# Patient Record
Sex: Female | Born: 1995 | Race: Black or African American | Hispanic: No | Marital: Single | Smoking: Never smoker
Health system: Southern US, Community
[De-identification: ages and names within clinical notes are randomized; demographics above are authoritative.]

## PROBLEM LIST (undated history)

## (undated) ENCOUNTER — Inpatient Hospital Stay (HOSPITAL_COMMUNITY): Payer: Self-pay

## (undated) DIAGNOSIS — Z789 Other specified health status: Secondary | ICD-10-CM

## (undated) HISTORY — PX: NO PAST SURGERIES: SHX2092

---
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
## 2018-04-12 ENCOUNTER — Encounter (HOSPITAL_COMMUNITY): Payer: Self-pay | Admitting: Emergency Medicine

## 2018-04-12 ENCOUNTER — Emergency Department (HOSPITAL_COMMUNITY)
Admission: EM | Admit: 2018-04-12 | Discharge: 2018-04-12 | Disposition: A | Discharge status: Home / Self Care | Payer: Self-pay | Attending: Emergency Medicine | Admitting: Emergency Medicine

## 2018-04-12 DIAGNOSIS — Z79899 Other long term (current) drug therapy: Secondary | ICD-10-CM | POA: Insufficient documentation

## 2018-04-12 DIAGNOSIS — B9789 Other viral agents as the cause of diseases classified elsewhere: Secondary | ICD-10-CM

## 2018-04-12 DIAGNOSIS — J069 Acute upper respiratory infection, unspecified: Secondary | ICD-10-CM | POA: Insufficient documentation

## 2018-04-12 DIAGNOSIS — F17290 Nicotine dependence, other tobacco product, uncomplicated: Secondary | ICD-10-CM | POA: Insufficient documentation

## 2018-04-12 MED ORDER — PREDNISONE 20 MG PO TABS
40.0000 mg | ORAL_TABLET | Freq: Once | ORAL | Status: AC
  Administered 2018-04-12: 40 mg via ORAL
  Filled 2018-04-12: qty 2

## 2018-04-12 MED ORDER — ALBUTEROL SULFATE HFA 108 (90 BASE) MCG/ACT IN AERS
2.0000 | INHALATION_SPRAY | Freq: Once | RESPIRATORY_TRACT | Status: AC
  Administered 2018-04-12: 2 via RESPIRATORY_TRACT
  Filled 2018-04-12: qty 6.7

## 2018-04-12 MED ORDER — PREDNISONE 20 MG PO TABS: 40.0000 mg | ORAL_TABLET | Freq: Every day | ORAL | 0 refills | Status: DC

## 2018-04-12 MED ORDER — IPRATROPIUM-ALBUTEROL 0.5-2.5 (3) MG/3ML IN SOLN
3.0000 mL | Freq: Once | RESPIRATORY_TRACT | Status: AC
  Administered 2018-04-12: 3 mL via RESPIRATORY_TRACT
  Filled 2018-04-12: qty 3

## 2018-04-12 NOTE — ED Notes (Signed)
ED Provider at bedside. 

## 2018-04-12 NOTE — ED Triage Notes (Signed)
Pt reports since Monday had sore throat, productive cough and chest pains when coughing. Report also has headaches.

## 2018-04-12 NOTE — Discharge Instructions (Signed)
It was my pleasure taking care of you today!   Take prednisone daily as directed getting tomorrow morning.  You were given your first dose in the emergency department today.  Use inhaler every 4-6 hours as needed for cough, wheezing.  Follow up with your primary care provider in 1 week for reevaluation.  Return to the emergency department for fevers, new or worsening symptoms, any additional concerns.

## 2018-04-12 NOTE — ED Provider Notes (Signed)
[facility]-EMERGENCY DEPT Provider Note   CSN: [id number] Arrival date & time: 04/12/18  1221     History   Chief Complaint Chief Complaint  Patient presents with  . Sore Throat  . Cough  . Headache    HPI Kellie Wilson is a 22 y.o. female.  The history is provided by the patient and medical records. No language interpreter was used.  Sore Throat  Associated symptoms include headaches. Pertinent negatives include no abdominal pain and no shortness of breath.  Cough  Associated symptoms include headaches and sore throat. Pertinent negatives include no shortness of breath.  Headache   Pertinent negatives include no shortness of breath, no nausea and no vomiting.   Kellie Wilson is an otherwise healthy 22 year old female who presents the emergency department for evaluation of cough, congestion over the last 2 days.  Associated with sore throat that is actually improving.  Last night, she felt like she could hear herself wheeze.  She denies any history of asthma or prior lung disease.  No medications taken prior to arrival for symptoms.  Denies any known sick contacts.   History reviewed. No pertinent past medical history.  There are no active problems to display for this patient.   History reviewed. No pertinent surgical history.   OB History   None      Home Medications    Prior to Admission medications   Medication Sig Start Date End Date Taking? Authorizing Provider  cetirizine (ZYRTEC ALLERGY) 10 MG tablet Take 1 tablet (10 mg total) by mouth daily. 09/25/14   Everlene Farrier, PA-C  clindamycin (CLEOCIN) 150 MG capsule Take 2 capsules (300 mg total) by mouth every 8 (eight) hours. 06/06/13   Mellody Drown, PA-C  DM-Doxylamine-Acetaminophen (VICKS NYQUIL COLD & FLU) 15-6.25-325 MG/15ML LIQD Take 15 mLs by mouth at bedtime as needed (cold/flu symptoms).    [provider]  fluticasone (FLONASE) 50 MCG/ACT nasal spray Place 2 sprays into  both nostrils daily. 09/25/14   Everlene Farrier, PA-C  ibuprofen (ADVIL,MOTRIN) 200 MG tablet Take 200 mg by mouth every 6 (six) hours as needed.    [provider]  predniSONE (DELTASONE) 20 MG tablet Take 2 tablets (40 mg total) by mouth daily. 04/12/18   Katelynne Revak, Kellie Picket, PA-C    Family History No family history on file.  Social History Social History   Tobacco Use  . Smoking status: Current Every Day Smoker    Types: Cigars  . Smokeless tobacco: Never Used  Substance Use Topics  . Alcohol use: No  . Drug use: No     Allergies   Patient has no known allergies.   Review of Systems Review of Systems  HENT: Positive for congestion and sore throat.   Respiratory: Positive for cough. Negative for shortness of breath.   Gastrointestinal: Negative for abdominal pain, nausea and vomiting.  Neurological: Positive for headaches.  All other systems reviewed and are negative.    Physical Exam Updated Vital Signs BP 138/89 (BP Location: Left Arm)   Pulse 77   Temp 97.6 F (36.4 C) (Oral)   Resp 15   LMP 04/05/2018   SpO2 100%   Physical Exam  Constitutional: She is oriented to person, place, and time. She appears well-developed and well-nourished. No distress.  HENT:  Head: Normocephalic and atraumatic.  + PND.  No tonsillar hypertrophy or exudates.  Neck: Neck supple.  Cardiovascular: Normal rate, regular rhythm and normal heart sounds.  No murmur heard. Pulmonary/Chest:  Effort normal. No respiratory distress.  Faint expiratory wheezing bilaterally.  Abdominal: Soft. She exhibits no distension. There is no tenderness.  Neurological: She is alert and oriented to person, place, and time.  Skin: Skin is warm and dry.  Nursing note and vitals reviewed.    ED Treatments / Results  Labs (all labs ordered are listed, but only abnormal results are displayed) Labs Reviewed - No data to display  EKG None  Radiology No results  found.  Procedures Procedures (including critical care time)  Medications Ordered in ED Medications  ipratropium-albuterol (DUONEB) 0.5-2.5 (3) MG/3ML nebulizer solution 3 mL (3 mLs Nebulization Given 04/12/18 1534)  predniSONE (DELTASONE) tablet 40 mg (40 mg Oral Given 04/12/18 1534)  albuterol (PROVENTIL HFA;VENTOLIN HFA) 108 (90 Base) MCG/ACT inhaler 2 puff (2 puffs Inhalation Given 04/12/18 1534)     Initial Impression / Assessment and Plan / ED Course  I have reviewed the triage vital signs and the nursing notes.  Pertinent labs & imaging results that were available during my care of the patient were reviewed by me and considered in my medical decision making (see chart for details).    Kellie Wilson is a 22 y.o. female who presents to ED for cough, congestion.  On exam, patient is nontoxic-appearing, hemodynamically stable with expiratory wheezing bilaterally.  Speaking in full sentences without difficulty.  Given neb treatment and prednisone in the ER and feels very much improved.  Upon initial evaluation, had recommended chest x-ray, however patient has to pick up her daughter at daycare and does not want to stay for imaging.  Exam clinically is not consistent with pneumonia.  Her lungs were clear after neb treatment.  Given prescription for steroid burst.  Inhaler as needed.  Discussed strict return precautions if symptoms worsen, develops a fever, etc.  All questions answered.    Final Clinical Impressions(s) / ED Diagnoses   Final diagnoses:  Viral URI with cough    ED Discharge Orders         Ordered    predniSONE (DELTASONE) 20 MG tablet  Daily     04/12/18 1531           Kellie Wilson, Kellie Picket, PA-C 04/12/18 1552    Kellie Lefevre, MD 04/12/18 1609

## 2020-11-11 ENCOUNTER — Ambulatory Visit: Payer: Self-pay

## 2020-11-11 ENCOUNTER — Other Ambulatory Visit: Payer: Self-pay

## 2021-02-13 ENCOUNTER — Encounter (HOSPITAL_COMMUNITY): Payer: Self-pay

## 2021-02-13 ENCOUNTER — Other Ambulatory Visit: Payer: Self-pay

## 2021-02-13 ENCOUNTER — Inpatient Hospital Stay (HOSPITAL_COMMUNITY)
Admission: AD | Admit: 2021-02-13 | Discharge: 2021-02-13 | Disposition: A | Discharge status: Home / Self Care | Payer: Medicaid Other | Attending: Obstetrics and Gynecology | Admitting: Obstetrics and Gynecology

## 2021-02-13 DIAGNOSIS — O479 False labor, unspecified: Secondary | ICD-10-CM

## 2021-02-13 DIAGNOSIS — O26893 Other specified pregnancy related conditions, third trimester: Secondary | ICD-10-CM | POA: Diagnosis not present

## 2021-02-13 DIAGNOSIS — Z0371 Encounter for suspected problem with amniotic cavity and membrane ruled out: Secondary | ICD-10-CM

## 2021-02-13 DIAGNOSIS — Z3A35 35 weeks gestation of pregnancy: Secondary | ICD-10-CM | POA: Diagnosis not present

## 2021-02-13 DIAGNOSIS — N898 Other specified noninflammatory disorders of vagina: Secondary | ICD-10-CM | POA: Insufficient documentation

## 2021-02-13 DIAGNOSIS — O4702 False labor before 37 completed weeks of gestation, second trimester: Secondary | ICD-10-CM | POA: Insufficient documentation

## 2021-02-13 HISTORY — DX: Other specified health status: Z78.9

## 2021-02-13 LAB — URINALYSIS, ROUTINE W REFLEX MICROSCOPIC
Bacteria, UA: NONE SEEN
Bilirubin Urine: NEGATIVE
Glucose, UA: NEGATIVE mg/dL
Hgb urine dipstick: NEGATIVE
Ketones, ur: 80 mg/dL — AB
Nitrite: NEGATIVE
Protein, ur: NEGATIVE mg/dL
Specific Gravity, Urine: 1.017 (ref 1.005–1.030)
pH: 6 (ref 5.0–8.0)

## 2021-02-13 LAB — WET PREP, GENITAL
Sperm: NONE SEEN
Trich, Wet Prep: NONE SEEN
Yeast Wet Prep HPF POC: NONE SEEN

## 2021-02-13 LAB — POCT FERN TEST: POCT Fern Test: NEGATIVE

## 2021-02-13 NOTE — MAU Provider Note (Signed)
CC:  Chief Complaint  Patient presents with   Abdominal Pain     Event Date/Time   First Provider Initiated Contact with Patient 02/13/21 1845      HPI: Kellie Wilson is a 25 y.o. year old G83P1001 female at [redacted]w[redacted]d weeks gestation who presents to MAU reporting cramping since 0900 and thin white discharge that has increased over the past two weeks and feels like her vulva is swollen and "heavy". Wonders if she has yeast infection again. Had IC today.   Associated Sx:  Vaginal bleeding: Denies Leaking of fluid: Uncertain Fetal movement: Nml  O: Patient Vitals for the past 24 hrs:  BP Temp Temp src Pulse Resp SpO2  02/13/21 1936 112/67 (!) 97.4 F (36.3 C) Oral 66 17 100 %  02/13/21 1738 117/72 -- -- 73 17 98 %    General: NAD Heart: Regular rate Lungs: Normal rate and effort Abd: Soft, NT, Gravid, S=D Pelvic: NEFG, Neg pooling, neg blood. Small amount of thin, white, mildly malodorous discharge Dilation: 1.5 Effacement (%): Thick Cervical Position: Posterior Station: Ballotable Presentation: Vertex Exam by:: Dorathy Kinsman, CNM  EFM: 135, Moderate variability, 15 x 15 accelerations, no decelerations Toco: Contractions every 3-5 minutes, mild  Results for orders placed or performed during the hospital encounter of 02/13/21 (from the past 24 hour(s))  Urinalysis, Routine w reflex microscopic Urine, Clean Catch     Status: Abnormal   Collection Time: 02/13/21  5:46 PM  Result Value Ref Range   Color, Urine YELLOW YELLOW   APPearance HAZY (A) CLEAR   Specific Gravity, Urine 1.017 1.005 - 1.030   pH 6.0 5.0 - 8.0   Glucose, UA NEGATIVE NEGATIVE mg/dL   Hgb urine dipstick NEGATIVE NEGATIVE   Bilirubin Urine NEGATIVE NEGATIVE   Ketones, ur 80 (A) NEGATIVE mg/dL   Protein, ur NEGATIVE NEGATIVE mg/dL   Nitrite NEGATIVE NEGATIVE   Leukocytes,Ua LARGE (A) NEGATIVE   RBC / HPF 6-10 0 - 5 RBC/hpf   WBC, UA 21-50 0 - 5 WBC/hpf   Bacteria, UA NONE SEEN NONE SEEN   Squamous  Epithelial / LPF 6-10 0 - 5   Mucus PRESENT   Results for JENIECE, HANNIS (MRN [id number]) as of 02/15/2021 20:52  Ref. Range 02/13/2021 20:04  Yeast Wet Prep HPF POC Latest Ref Range: NONE SEEN  NONE SEEN  Trich, Wet Prep Latest Ref Range: NONE SEEN  NONE SEEN  Clue Cells Wet Prep HPF POC Latest Ref Range: NONE SEEN  PRESENT (A)  WBC, Wet Prep HPF POC Latest Ref Range: NONE SEEN  MANY (A)    Fern negative   Orders Placed This Encounter  Procedures   Wet prep, genital   Culture, OB Urine   Urinalysis, Routine w reflex microscopic Urine, Clean Catch   POCT fern test   Discharge patient   No orders of the defined types were placed in this encounter.  MDM - Braxton hicks w/out evidence of active preterm labor - Vaginal discharge and sensation of vulvar swelling. Exam and wet prep C/W BV. Rx Flagyl. No evidence of ROM.   A: [redacted]w[redacted]d week IUP 1. No leakage of amniotic fluid into vagina   2. Vaginal discharge during pregnancy in third trimester   3. Braxton Hicks contractions   FHR reactive  P: Discharge home in stable condition. Preterm labor precautions and fetal kick counts. Follow-up as scheduled for prenatal visit or sooner as needed if symptoms worsen. Return to maternity admissions as needed if symptoms worsen.  Katrinka Blazing, [place], [place] 02/13/2021 7:39 PM  3

## 2021-02-13 NOTE — MAU Note (Addendum)
...  Kellie Wilson is a 25 y.o. at [redacted]w[redacted]d here in MAU reporting: lower abdominal cramping/contractions that began around 0900 this morning shortly after having IC. She states prior to having IC this morning she had a bowel movement and her vagina felt heavy and swollen. Patient denies any bleeding or LOF. +FM. Patient endorses having a yeast infection one month ago and states she received treatment but is wondering if it is returning. She states her discharge does not have an odor, but around two weeks ago it increased in amount and is clear/white and creamy.   Pain score: 5 lower abdominal  Lab orders placed from triage: UA

## 2021-02-15 LAB — CULTURE, OB URINE: Culture: 2000 — AB

## 2021-02-16 ENCOUNTER — Telehealth: Payer: Self-pay | Admitting: Advanced Practice Midwife

## 2021-02-16 ENCOUNTER — Other Ambulatory Visit: Payer: Self-pay | Admitting: Advanced Practice Midwife

## 2021-02-16 ENCOUNTER — Encounter: Payer: Self-pay | Admitting: Advanced Practice Midwife

## 2021-02-16 DIAGNOSIS — N760 Acute vaginitis: Secondary | ICD-10-CM

## 2021-02-16 DIAGNOSIS — B9689 Other specified bacterial agents as the cause of diseases classified elsewhere: Secondary | ICD-10-CM

## 2021-02-16 DIAGNOSIS — R8271 Bacteriuria: Secondary | ICD-10-CM | POA: Insufficient documentation

## 2021-02-16 LAB — GC/CHLAMYDIA PROBE AMP (~~LOC~~) NOT AT ARMC
Chlamydia: NEGATIVE
Comment: NEGATIVE
Comment: NORMAL
Neisseria Gonorrhea: NEGATIVE

## 2021-02-16 MED ORDER — METRONIDAZOLE 500 MG PO TABS: 500.0000 mg | ORAL_TABLET | Freq: Two times a day (BID) | ORAL | 0 refills | Status: DC

## 2021-02-16 NOTE — Progress Notes (Signed)
Your Wet Prep showed that the discharge you where having is Bacterial Vaginosis, an overgrowth of normal bacteria of your vagina. It is not an STD and your partner does not need to be treated. It can make you have more preterm contractions so I have sent an antibiotic to your pharmacy. Take it twice a days for 7 days. Take it with food.

## 2021-02-16 NOTE — Telephone Encounter (Signed)
Attempted to reach patient to notify of GBS bacteruria, abx not indicated but will require tx in labor. VLTCB. Problem list updated. Will attempt contact again later today.  Clayton Bibles, MSN, CNM Certified Nurse Midwife, [facility] [place] for [employer], [facility] 02/16/21 9:23 AM

## 2021-02-16 NOTE — Telephone Encounter (Signed)
Patient reached at home. Identity confirmed. Discussed GBS positive results, impact on labor course. Pt denied questions at end of call.   Clayton Bibles, MSN, CNM Certified Nurse Midwife, [employer] for [employer], [facility] 02/16/21 4:16 PM

## 2021-02-18 DIAGNOSIS — Z3493 Encounter for supervision of normal pregnancy, unspecified, third trimester: Secondary | ICD-10-CM | POA: Insufficient documentation

## 2021-02-18 DIAGNOSIS — O365990 Maternal care for other known or suspected poor fetal growth, unspecified trimester, not applicable or unspecified: Secondary | ICD-10-CM | POA: Insufficient documentation

## 2021-02-23 DIAGNOSIS — O365930 Maternal care for other known or suspected poor fetal growth, third trimester, not applicable or unspecified: Secondary | ICD-10-CM | POA: Insufficient documentation

## 2021-03-12 DIAGNOSIS — O99820 Streptococcus B carrier state complicating pregnancy: Secondary | ICD-10-CM | POA: Insufficient documentation

## 2023-03-13 ENCOUNTER — Other Ambulatory Visit: Payer: Self-pay | Admitting: *Deleted

## 2023-03-13 ENCOUNTER — Encounter: Payer: Self-pay | Admitting: *Deleted

## 2023-03-13 ENCOUNTER — Other Ambulatory Visit (INDEPENDENT_AMBULATORY_CARE_PROVIDER_SITE_OTHER): Payer: Medicaid Other

## 2023-03-13 ENCOUNTER — Ambulatory Visit (INDEPENDENT_AMBULATORY_CARE_PROVIDER_SITE_OTHER): Payer: Medicaid Other | Admitting: *Deleted

## 2023-03-13 VITALS — BP 111/69 | HR 60 | Wt 196.7 lb

## 2023-03-13 DIAGNOSIS — Z3480 Encounter for supervision of other normal pregnancy, unspecified trimester: Secondary | ICD-10-CM

## 2023-03-13 DIAGNOSIS — Z3481 Encounter for supervision of other normal pregnancy, first trimester: Secondary | ICD-10-CM

## 2023-03-13 DIAGNOSIS — Z3A10 10 weeks gestation of pregnancy: Secondary | ICD-10-CM | POA: Diagnosis not present

## 2023-03-13 DIAGNOSIS — O3680X0 Pregnancy with inconclusive fetal viability, not applicable or unspecified: Secondary | ICD-10-CM

## 2023-03-13 DIAGNOSIS — Z1339 Encounter for screening examination for other mental health and behavioral disorders: Secondary | ICD-10-CM | POA: Diagnosis not present

## 2023-03-13 MED ORDER — BLOOD PRESSURE KIT DEVI
1.0000 | 0 refills | Status: DC
Start: 2023-03-13 — End: 2023-09-22

## 2023-03-13 NOTE — Patient Instructions (Signed)

## 2023-03-13 NOTE — Progress Notes (Signed)
New OB Intake  I connected with Kellie Wilson  on 03/13/23 at  9:15 AM EDT by In Person Visit and verified that I am speaking with the correct person using two identifiers. Nurse is located at [facility] and pt is located at [place].  I discussed the limitations, risks, security and privacy concerns of performing an evaluation and management service by telephone and the availability of in person appointments. I also discussed with the patient that there may be a patient responsible charge related to this service. The patient expressed understanding and agreed to proceed.  I explained I am completing New OB Intake today. We discussed EDD of 09/18/2023, by Last Menstrual Period. Pt is G3P1001. I reviewed her allergies, medications and Medical/Surgical/OB history.    Patient Active Problem List   Diagnosis Date Noted   GBS bacteriuria 02/16/2021    Concerns addressed today  Delivery Plans Plans to deliver at [facility]. Discussed the nature of our practice with multiple providers including residents and students. Due to the size of the practice, the delivering provider may not be the same as those providing prenatal care.   Patient is interested in water birth. Offered upcoming OB visit with CNM to discuss further.  MyChart/Babyscripts MyChart access verified. I explained pt will have some visits in office and some virtually. Babyscripts instructions given and order placed. Patient verifies receipt of registration text/e-mail. Account successfully created and app downloaded.  Blood Pressure Cuff/Weight Scale Blood pressure cuff ordered for patient to pick-up from [employer]. Explained after first prenatal appt pt will check weekly and document in Babyscripts. Patient does not have weight scale; patient may purchase if they desire to track weight weekly in Babyscripts.  Anatomy US Explained first scheduled [place] will be around 19 weeks. Anatomy US scheduled for TBD at TBD.  Interested in [place]?  If yes, send referral and doula dot phrase.   Is patient a candidate for Babyscripts Optimization? Yes  First visit review I reviewed new OB appt with patient. Explained pt will be seen by Sharen Counter, CNM at first visit. Discussed Avelina Laine genetic screening with patient. Requests Panorama and Horizon.. Routine prenatal labs  collected at today's visit.    Last Pap No results found for: "DIAGPAP"  Harrel Lemon, RN 03/13/2023  9:21 AM

## 2023-03-14 LAB — CBC/D/PLT+RPR+RH+ABO+RUBIGG...
Antibody Screen: NEGATIVE
Basophils Absolute: 0 10*3/uL (ref 0.0–0.2)
Basos: 0 %
EOS (ABSOLUTE): 0.1 10*3/uL (ref 0.0–0.4)
Eos: 1 %
HCV Ab: NONREACTIVE
HIV Screen 4th Generation wRfx: NONREACTIVE
Hematocrit: 34.9 % (ref 34.0–46.6)
Hemoglobin: 11.2 g/dL (ref 11.1–15.9)
Hepatitis B Surface Ag: NEGATIVE
Immature Grans (Abs): 0 10*3/uL (ref 0.0–0.1)
Immature Granulocytes: 0 %
Lymphocytes Absolute: 1.8 10*3/uL (ref 0.7–3.1)
Lymphs: 24 %
MCH: 29.2 pg (ref 26.6–33.0)
MCHC: 32.1 g/dL (ref 31.5–35.7)
MCV: 91 fL (ref 79–97)
Monocytes Absolute: 0.4 10*3/uL (ref 0.1–0.9)
Monocytes: 6 %
Neutrophils Absolute: 5.3 10*3/uL (ref 1.4–7.0)
Neutrophils: 69 %
Platelets: 205 10*3/uL (ref 150–450)
RBC: 3.83 x10E6/uL (ref 3.77–5.28)
RDW: 11.2 % — ABNORMAL LOW (ref 11.7–15.4)
RPR Ser Ql: NONREACTIVE
Rh Factor: POSITIVE
Rubella Antibodies, IGG: 2.36 {index} (ref >0.99)
WBC: 7.6 10*3/uL (ref 3.4–10.8)

## 2023-03-14 LAB — HEMOGLOBIN A1C
Est. average glucose Bld gHb Est-mCnc: 88 mg/dL
Hgb A1c MFr Bld: 4.7 % — ABNORMAL LOW (ref 4.8–5.6)

## 2023-03-14 LAB — HCV INTERPRETATION

## 2023-03-15 LAB — CULTURE, OB URINE

## 2023-03-15 LAB — URINE CULTURE, OB REFLEX

## 2023-03-19 LAB — PANORAMA PRENATAL TEST FULL PANEL:PANORAMA TEST PLUS 5 ADDITIONAL MICRODELETIONS: FETAL FRACTION: 11.1

## 2023-03-24 LAB — HORIZON CUSTOM: REPORT SUMMARY: POSITIVE — AB

## 2023-03-27 ENCOUNTER — Other Ambulatory Visit (HOSPITAL_COMMUNITY)
Admission: RE | Admit: 2023-03-27 | Discharge: 2023-03-27 | Disposition: A | Discharge status: Home / Self Care | Payer: Medicaid Other | Source: Ambulatory Visit | Attending: Advanced Practice Midwife | Admitting: Advanced Practice Midwife

## 2023-03-27 ENCOUNTER — Encounter: Payer: Self-pay | Admitting: Advanced Practice Midwife

## 2023-03-27 ENCOUNTER — Ambulatory Visit (INDEPENDENT_AMBULATORY_CARE_PROVIDER_SITE_OTHER): Payer: Medicaid Other | Admitting: Advanced Practice Midwife

## 2023-03-27 VITALS — BP 115/67 | HR 60 | Wt 198.0 lb

## 2023-03-27 DIAGNOSIS — Z124 Encounter for screening for malignant neoplasm of cervix: Secondary | ICD-10-CM

## 2023-03-27 DIAGNOSIS — Z3480 Encounter for supervision of other normal pregnancy, unspecified trimester: Secondary | ICD-10-CM

## 2023-03-27 DIAGNOSIS — Z87898 Personal history of other specified conditions: Secondary | ICD-10-CM

## 2023-03-27 DIAGNOSIS — Z113 Encounter for screening for infections with a predominantly sexual mode of transmission: Secondary | ICD-10-CM | POA: Diagnosis present

## 2023-03-27 DIAGNOSIS — Z3A12 12 weeks gestation of pregnancy: Secondary | ICD-10-CM

## 2023-03-27 DIAGNOSIS — Z3481 Encounter for supervision of other normal pregnancy, first trimester: Secondary | ICD-10-CM | POA: Diagnosis not present

## 2023-03-27 NOTE — Progress Notes (Signed)
Subjective:   Kellie Wilson is a 27 y.o. [id number] at [redacted]w[redacted]d by LMP c/w early [place] being seen today for her first obstetrical visit.  Her obstetrical history is significant for intrauterine growth restriction (IUGR) and has Supervision of other normal pregnancy, antepartum on their problem list.. Patient does intend to breast feed. Pregnancy history fully reviewed.  Patient reports  sharp lower abdominal pain with movement .  HISTORY: OB History  Gravida Para Term Preterm AB Living  5 2 2  0 2 1  SAB IAB Ectopic Multiple Live Births  0 2 0 0 1    # Outcome Date GA Lbr Len/2nd Weight Sex Type Anes PTL Lv  5 Current           4 IAB 2024          3 IAB 2024          2 Term 03/12/21    M Vag-Spont   LIV  1 Term 01/27/16 [redacted]w[redacted]d    Vag-Spont      Past Medical History:  Diagnosis Date   Medical history non-contributory    Past Surgical History:  Procedure Laterality Date   NO PAST SURGERIES     Family History  Problem Relation Age of Onset   Diabetes Maternal Grandmother    Social History   Tobacco Use   Smoking status: Never   Smokeless tobacco: Never  Vaping Use   Vaping status: Former   Quit date: 12/26/2022  Substance Use Topics   Alcohol use: No   Drug use: No   No Known Allergies Current Outpatient Medications on File Prior to Visit  Medication Sig Dispense Refill   Prenatal Vit-Fe Fumarate-FA (PRENATAL MULTIVITAMIN) TABS tablet Take 1 tablet by mouth daily at 12 noon.     aspirin EC 81 MG tablet Take 81 mg by mouth daily. Swallow whole. (Patient not taking: Reported on 03/27/2023)     Blood Pressure Monitoring (BLOOD PRESSURE KIT) DEVI 1 Device by Does not apply route once a week. (Patient not taking: Reported on 03/27/2023) 1 each 0   metroNIDAZOLE (FLAGYL) 500 MG tablet Take 1 tablet (500 mg total) by mouth 2 (two) times daily. (Patient not taking: Reported on 03/27/2023) 14 tablet 0   No current facility-administered medications on file prior to visit.      Indications for ASA therapy (per uptodate) One of the following: Previous pregnancy with preeclampsia, especially early onset and with an adverse outcome No Multifetal gestation No Chronic hypertension No Type 1 or 2 diabetes mellitus No Chronic kidney disease No Autoimmune disease (antiphospholipid syndrome, systemic lupus erythematosus) No   Two or more of the following: Nulliparity No Obesity (body mass index >30 kg/m2) No Family history of preeclampsia in mother or sister No Age >=35 years No Sociodemographic characteristics (African American race, low socioeconomic level) Yes Personal risk factors (eg, previous pregnancy with low birth weight or small for gestational age infant, previous adverse pregnancy outcome [eg, stillbirth], interval >10 years between pregnancies) Yes   Indications for early 1 hour GTT (per uptodate)  BMI >25 (>23 in Asian women) AND one of the following  Gestational diabetes mellitus in a previous pregnancy No Glycated hemoglobin >=5.7 percent (39 mmol/mol), impaired glucose tolerance, or impaired fasting glucose on previous testing No First-degree relative with diabetes No High-risk race/ethnicity (eg, African American, Latino, Native American, [place] American, Pacific Islander) Yes History of cardiovascular disease No Hypertension or on therapy for hypertension No High-density lipoprotein cholesterol level <35  mg/dL (1.61 mmol/L) and/or a triglyceride level >250 mg/dL (0.96 mmol/L) No Polycystic ovary syndrome No Physical inactivity No Other clinical condition associated with insulin resistance (eg, severe obesity, acanthosis nigricans) No Previous birth of an infant weighing >=4000 g No Previous stillbirth of unknown cause No Exam   Vitals:   03/27/23 1541  BP: 115/67  Pulse: 60  Weight: 198 lb (89.8 kg)   Fetal Heart Rate (bpm): 154  Uterus:     Pelvic Exam: Perineum: no hemorrhoids, normal perineum   Vulva: normal external genitalia, no  lesions   Vagina:  normal mucosa, normal discharge   Cervix: no lesions and normal, pap smear done.    Adnexa: normal adnexa and no mass, fullness, tenderness   Bony Pelvis: average  System: General: well-developed, well-nourished female in no acute distress   Breast:  normal appearance, no masses or tenderness   Skin: normal coloration and turgor, no rashes   Neurologic: oriented, normal, negative, normal mood   Extremities: normal strength, tone, and muscle mass, ROM of all joints is normal   HEENT PERRLA, extraocular movement intact and sclera clear, anicteric   Mouth/Teeth mucous membranes moist, pharynx normal without lesions and dental hygiene good   Neck supple and no masses   Cardiovascular: regular rate and rhythm   Respiratory:  no respiratory distress, normal breath sounds   Abdomen: soft, non-tender; bowel sounds normal; no masses,  no organomegaly     Assessment:   Pregnancy: [id number] Patient Active Problem List   Diagnosis Date Noted   Supervision of other normal pregnancy, antepartum 03/13/2023   VS reviewed, nursing note reviewed,  Constitutional: well developed, well nourished, no distress HEENT: normocephalic CV: normal rate Pulm/chest wall: normal effort Abdomen: soft Neuro: alert and oriented x 3 Skin: warm, dry Psych: affect normal  Plan:  1. Encounter for screening for cervical cancer  - Cytology - PAP( [place])  2. Routine screening for STI (sexually transmitted infection)  - Cervicovaginal ancillary only( [place])  3. History of poor fetal growth --IOL for FGR at 39 weeks with previous pregnancy, no complications at delivery  4. [redacted] weeks gestation of pregnancy   5. Supervision of other normal pregnancy, antepartum --Anticipatory guidance about next visits/weeks of pregnancy given.   - Pt is interested in waterbirth.  No contraindications at this time per chart review/patient assessment.   - Pt to enroll in class, see CNMs for most  visits in the office.  - Discussed waterbirth as option for low-risk pregnancy.  Reviewed conditions that may arise during pregnancy that will risk pt out of waterbirth including hypertension, diabetes, fetal growth restriction <10%ile, etc.   Initial labs reviewed. Continue prenatal vitamins. NIPS: results reviewed. Ultrasound discussed; fetal anatomic survey: requested. Problem list reviewed and updated. The nature of [facility] with multiple MDs and other Advanced Practice Providers was explained to patient; also emphasized that residents, students are part of our team. Routine obstetric precautions reviewed. No follow-ups on file.   Sharen Counter, CNM 03/27/23 5:45 PM

## 2023-03-29 ENCOUNTER — Encounter: Payer: Self-pay | Admitting: Obstetrics and Gynecology

## 2023-03-29 DIAGNOSIS — D563 Thalassemia minor: Secondary | ICD-10-CM | POA: Insufficient documentation

## 2023-03-29 LAB — CERVICOVAGINAL ANCILLARY ONLY
Bacterial Vaginitis (gardnerella): POSITIVE — AB
Candida Glabrata: NEGATIVE
Candida Vaginitis: NEGATIVE
Chlamydia: NEGATIVE
Comment: NEGATIVE
Comment: NEGATIVE
Comment: NEGATIVE
Comment: NEGATIVE
Comment: NEGATIVE
Comment: NORMAL
Neisseria Gonorrhea: NEGATIVE
Trichomonas: NEGATIVE

## 2023-03-30 LAB — CYTOLOGY - PAP
Comment: NEGATIVE
Diagnosis: NEGATIVE
High risk HPV: NEGATIVE

## 2023-03-31 MED ORDER — METRONIDAZOLE 500 MG PO TABS: 500.0000 mg | ORAL_TABLET | Freq: Two times a day (BID) | ORAL | 0 refills | Status: DC

## 2023-03-31 NOTE — Addendum Note (Signed)
Addended by: Sharen Counter A on: 03/31/2023 09:17 AM   Modules accepted: Orders

## 2023-04-05 ENCOUNTER — Other Ambulatory Visit: Payer: Self-pay

## 2023-04-05 DIAGNOSIS — D563 Thalassemia minor: Secondary | ICD-10-CM

## 2023-04-24 ENCOUNTER — Encounter: Payer: Self-pay | Admitting: Advanced Practice Midwife

## 2023-04-24 ENCOUNTER — Ambulatory Visit (INDEPENDENT_AMBULATORY_CARE_PROVIDER_SITE_OTHER): Payer: Medicaid Other | Admitting: Advanced Practice Midwife

## 2023-04-24 VITALS — BP 117/72 | HR 82 | Wt 204.0 lb

## 2023-04-24 DIAGNOSIS — N643 Galactorrhea not associated with childbirth: Secondary | ICD-10-CM | POA: Insufficient documentation

## 2023-04-24 DIAGNOSIS — Z3A16 16 weeks gestation of pregnancy: Secondary | ICD-10-CM

## 2023-04-24 DIAGNOSIS — Z87898 Personal history of other specified conditions: Secondary | ICD-10-CM

## 2023-04-24 DIAGNOSIS — R102 Pelvic and perineal pain: Secondary | ICD-10-CM

## 2023-04-24 DIAGNOSIS — O26892 Other specified pregnancy related conditions, second trimester: Secondary | ICD-10-CM

## 2023-04-24 DIAGNOSIS — Z3480 Encounter for supervision of other normal pregnancy, unspecified trimester: Secondary | ICD-10-CM

## 2023-04-24 NOTE — Progress Notes (Signed)
   PRENATAL VISIT NOTE  Subjective:  Kellie Wilson is a 27 y.o. [id number] at [redacted]w[redacted]d being seen today for ongoing prenatal care.  She is currently monitored for the following issues for this low-risk pregnancy and has Supervision of other normal pregnancy, antepartum; History of poor fetal growth; Alpha thalassemia silent carrier; and Galactorrhea on their problem list.  Patient reports  mild lower abdominal pressure intermittently .  Contractions: Not present. Vag. Bleeding: None.  Movement: Present. Denies leaking of fluid.   The following portions of the patient's history were reviewed and updated as appropriate: allergies, current medications, past family history, past medical history, past social history, past surgical history and problem list.   Objective:   Vitals:   04/24/23 1541  BP: 117/72  Pulse: 82  Weight: 204 lb (92.5 kg)    Fetal Status: Fetal Heart Rate (bpm): 140   Movement: Present     General:  Alert, oriented and cooperative. Patient is in no acute distress.  Skin: Skin is warm and dry. No rash noted.   Cardiovascular: Normal heart rate noted  Respiratory: Normal respiratory effort, no problems with respiration noted  Abdomen: Soft, gravid, appropriate for gestational age.  Pain/Pressure: Present (a lot of pressure)     Pelvic: Cervical exam deferred        Extremities: Normal range of motion.  Edema: Trace (little in left foot)  Mental Status: Normal mood and affect. Normal behavior. Normal judgment and thought content.   Assessment and Plan:  Pregnancy: [id number] at [redacted]w[redacted]d 1. Supervision of other normal pregnancy, antepartum --Anticipatory guidance about next visits/weeks of pregnancy given.  --Questions answered about safe foods in pregnancy, etc --discussed report of galactorrhea in pt prenatal from last pregnancy. Pt reports she would get milk if she stimulated breasts, even 5  years after her daughter was born. --Discussed normal breast changes after lactation  and reviewed reasons to seek care for breast discharge.  Discussed leaking colostrum in pregnancy and colostrum collection, safe guidelines.   - Pt is interested in waterbirth.  No contraindications at this time per chart review/patient assessment.   - Pt to enroll in class, see CNMs for most visits in the office.  - Discussed waterbirth as option for low-risk pregnancy.  Reviewed conditions that may arise during pregnancy that will risk pt out of waterbirth including hypertension, diabetes, fetal growth restriction <10%ile, etc.   2. History of poor fetal growth --Pt followed with previous pregnancy for FGR, at delivery, infant weighed 6lb8oz.    3. [redacted] weeks gestation of pregnancy   4. Pelvic pressure in pregnancy, antepartum, second trimester --Rest/ice/heat/warm bath/increase PO fluids/Tylenol/pregnancy support belt  --Plan for urine culture but pt did not leave sample, will follow up and pt to return if symptoms persist.   Preterm labor symptoms and general obstetric precautions including but not limited to vaginal bleeding, contractions, leaking of fluid and fetal movement were reviewed in detail with the patient. Please refer to After Visit Summary for other counseling recommendations.   Return in about 4 weeks (around 05/22/2023) for Midwife preferred.  Future Appointments  Date Time Provider Department Center  05/15/2023 12:15 PM [facility] NURSE [facility]  05/15/2023 12:30 PM [facility] US2 [facility]  05/15/2023  1:30 PM [facility] GENETIC COUNSELING RM [facility]  05/22/2023  3:30 PM Lennart Pall, MD [facility] None    Sharen Counter, CNM

## 2023-04-27 ENCOUNTER — Other Ambulatory Visit: Payer: Self-pay

## 2023-04-27 DIAGNOSIS — N898 Other specified noninflammatory disorders of vagina: Secondary | ICD-10-CM

## 2023-04-27 MED ORDER — TERCONAZOLE 0.4 % VA CREA
1.0000 | TOPICAL_CREAM | Freq: Every day | VAGINAL | 0 refills | Status: DC
Start: 2023-04-27 — End: 2023-09-12

## 2023-05-01 DIAGNOSIS — O99210 Obesity complicating pregnancy, unspecified trimester: Secondary | ICD-10-CM | POA: Insufficient documentation

## 2023-05-15 ENCOUNTER — Ambulatory Visit: Payer: Medicaid Other | Attending: Obstetrics and Gynecology

## 2023-05-15 ENCOUNTER — Ambulatory Visit (HOSPITAL_BASED_OUTPATIENT_CLINIC_OR_DEPARTMENT_OTHER): Payer: Medicaid Other

## 2023-05-15 ENCOUNTER — Ambulatory Visit: Payer: Medicaid Other | Admitting: *Deleted

## 2023-05-15 ENCOUNTER — Encounter: Payer: Self-pay | Admitting: *Deleted

## 2023-05-15 ENCOUNTER — Other Ambulatory Visit: Payer: Self-pay | Admitting: *Deleted

## 2023-05-15 VITALS — BP 120/68 | HR 65

## 2023-05-15 DIAGNOSIS — Z148 Genetic carrier of other disease: Secondary | ICD-10-CM | POA: Insufficient documentation

## 2023-05-15 DIAGNOSIS — O09292 Supervision of pregnancy with other poor reproductive or obstetric history, second trimester: Secondary | ICD-10-CM | POA: Diagnosis not present

## 2023-05-15 DIAGNOSIS — Z8759 Personal history of other complications of pregnancy, childbirth and the puerperium: Secondary | ICD-10-CM

## 2023-05-15 DIAGNOSIS — O99210 Obesity complicating pregnancy, unspecified trimester: Secondary | ICD-10-CM

## 2023-05-15 DIAGNOSIS — Z3480 Encounter for supervision of other normal pregnancy, unspecified trimester: Secondary | ICD-10-CM | POA: Insufficient documentation

## 2023-05-15 DIAGNOSIS — Z363 Encounter for antenatal screening for malformations: Secondary | ICD-10-CM | POA: Diagnosis present

## 2023-05-15 DIAGNOSIS — Z3A19 19 weeks gestation of pregnancy: Secondary | ICD-10-CM | POA: Diagnosis not present

## 2023-05-15 DIAGNOSIS — O09299 Supervision of pregnancy with other poor reproductive or obstetric history, unspecified trimester: Secondary | ICD-10-CM

## 2023-05-15 DIAGNOSIS — O321XX0 Maternal care for breech presentation, not applicable or unspecified: Secondary | ICD-10-CM | POA: Insufficient documentation

## 2023-05-15 DIAGNOSIS — D563 Thalassemia minor: Secondary | ICD-10-CM | POA: Diagnosis not present

## 2023-05-15 DIAGNOSIS — O99212 Obesity complicating pregnancy, second trimester: Secondary | ICD-10-CM | POA: Insufficient documentation

## 2023-05-15 NOTE — Progress Notes (Signed)
[facility] for [facility]aternal [facility]etal [facility] at [facility] [address] Phone:  [phone]   Fax:  [phone]      In-Person Genetic Counseling Clinic Note:   I spoke with 27 y.o. Jalayia Elizardo today to discuss her carrier screening results. She is a silent carrier for alpha thalassemia. She was referred by Velvet Bathe, MD. She was accompanied by her partner Danelle Earthly and their son.   Pregnancy History:    [id number]. EGA: 105w4d by [place]. EDD: 10/05/2023. Denies personal history of diabetes, high blood pressure, thyroid conditions, and seizures. Denies bleeding, infections, and fevers in this pregnancy. Denies using tobacco, alcohol, or street drugs in this pregnancy.   Family History:    A three-generation pedigree was created and scanned into Epic under the Media tab.  Maternal ethnicity reported as Black and paternal ethnicity reported as Black. Denies Ashkenazi Jewish ancestry.  Family history not remarkable for consanguinity, individuals with birth defects, intellectual disability, autism spectrum disorder, multiple spontaneous abortions, still births, or unexplained neonatal death.   Silent Carrier for Alpha Thalassemia:  We reviewed the genetics of alpha thalassemia, autosomal recessive mode of inheritance, and clinical features of the conditions.  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 She screened negative for the other three conditions (CF, SMA, and beta-hemoglobinopathies). Please see report for details.  We reviewed that Caeleigh will either pass down two copies of the alpha globin gene (??) OR one copy of the alpha globin gene (-?) in each pregnancy. Therefore, this pregnancy is not at increased risk for hemoglobin Bart's due to four deletions of the alpha globin genes (--/--) regardless of her reproductive partner's carrier status. The pregnancy may be at  increased risk for hemoglobin H disease if her partner is an alpha thalassemia carrier in the cis configuration (--/??). We reviewed that this is more common in [place] Asian populations and less common in those with Black ancestry.  Given these results, we discussed and offered carrier screening for Talor's reproductive partner Danelle Earthly. Since they were limited on time as they needed to pick up their daughter from school, we were not able to draw Malik's blood for carrier screening today. We discussed the options of coming in another day or being given a saliva kit. The couple was given a Horizon saliva kit for carrier screening for alpha thalassemia. Malik consented we call Keaghan with the results.  We discussed that if Danelle Earthly is also a carrier, prenatal diagnosis through amniocentesis can also be performed. We reviewed the technical aspects, benefits, risks, and limitations of amniocentesis including the 1 in 500 risk for miscarriage. If prenatal testing is not desired, genetic testing can be performed postnatally.   Previous Testing Completed:  Low risk NIPS: Marykay previously completed noninvasive prenatal screening (NIPS) in this pregnancy. The result is low risk, consistent with a female fetus. This screening significantly reduces but does not eliminate the chance that the current pregnancy has Down syndrome (trisomy 19), trisomy 63, trisomy 45, common sex chromosome conditions, and 22q11.2 microdeletion syndrome. Please see report for details. There are many genetic conditions that cannot be detected by NIPS.    Plan of Care:   Horizon saliva kit was given to Selen's partner for alpha thalassemia carrier screening. He consented we call Theora with the results.   Informed consent was obtained. All questions were answered.   I spent 30 minutes  in the care of the patient today, including face-to-face time reviewing and  discussing the genetic test results and available next steps.    Thank  you for sharing in the care of Raelan with [place].  Please do not hesitate to contact us at [phone] if you have any questions.   Sheppard Plumber, MS Genetic Counselor   Genetic counseling student involved in appointment: No.

## 2023-05-22 ENCOUNTER — Encounter: Payer: Medicaid Other | Admitting: Obstetrics and Gynecology

## 2023-06-05 ENCOUNTER — Encounter (HOSPITAL_COMMUNITY): Payer: Self-pay | Admitting: Obstetrics & Gynecology

## 2023-06-05 ENCOUNTER — Encounter: Payer: Self-pay | Admitting: Obstetrics and Gynecology

## 2023-06-05 ENCOUNTER — Inpatient Hospital Stay (HOSPITAL_COMMUNITY)
Admission: AD | Admit: 2023-06-05 | Discharge: 2023-06-05 | Disposition: A | Discharge status: Home / Self Care | Payer: Medicaid Other | Attending: Obstetrics & Gynecology | Admitting: Obstetrics & Gynecology

## 2023-06-05 ENCOUNTER — Other Ambulatory Visit: Payer: Self-pay

## 2023-06-05 ENCOUNTER — Ambulatory Visit (INDEPENDENT_AMBULATORY_CARE_PROVIDER_SITE_OTHER): Payer: Medicaid Other | Admitting: Obstetrics and Gynecology

## 2023-06-05 ENCOUNTER — Telehealth: Payer: Self-pay | Admitting: *Deleted

## 2023-06-05 VITALS — BP 122/78 | HR 103 | Wt 205.2 lb

## 2023-06-05 DIAGNOSIS — R197 Diarrhea, unspecified: Secondary | ICD-10-CM | POA: Diagnosis not present

## 2023-06-05 DIAGNOSIS — O26892 Other specified pregnancy related conditions, second trimester: Secondary | ICD-10-CM | POA: Diagnosis not present

## 2023-06-05 DIAGNOSIS — Z3480 Encounter for supervision of other normal pregnancy, unspecified trimester: Secondary | ICD-10-CM

## 2023-06-05 DIAGNOSIS — O99612 Diseases of the digestive system complicating pregnancy, second trimester: Secondary | ICD-10-CM | POA: Diagnosis not present

## 2023-06-05 DIAGNOSIS — Z3A22 22 weeks gestation of pregnancy: Secondary | ICD-10-CM | POA: Diagnosis not present

## 2023-06-05 DIAGNOSIS — R112 Nausea with vomiting, unspecified: Secondary | ICD-10-CM

## 2023-06-05 DIAGNOSIS — Z87898 Personal history of other specified conditions: Secondary | ICD-10-CM

## 2023-06-05 DIAGNOSIS — K529 Noninfective gastroenteritis and colitis, unspecified: Secondary | ICD-10-CM | POA: Insufficient documentation

## 2023-06-05 DIAGNOSIS — O219 Vomiting of pregnancy, unspecified: Secondary | ICD-10-CM | POA: Insufficient documentation

## 2023-06-05 DIAGNOSIS — R102 Pelvic and perineal pain: Secondary | ICD-10-CM | POA: Diagnosis present

## 2023-06-05 DIAGNOSIS — R1110 Vomiting, unspecified: Secondary | ICD-10-CM | POA: Diagnosis present

## 2023-06-05 LAB — CBC
HCT: 34.5 % — ABNORMAL LOW (ref 36.0–46.0)
Hemoglobin: 11.1 g/dL — ABNORMAL LOW (ref 12.0–15.0)
MCH: 28.5 pg (ref 26.0–34.0)
MCHC: 32.2 g/dL (ref 30.0–36.0)
MCV: 88.7 fL (ref 80.0–100.0)
Platelets: 217 10*3/uL (ref 150–400)
RBC: 3.89 MIL/uL (ref 3.87–5.11)
RDW: 12.3 % (ref 11.5–15.5)
WBC: 11.7 10*3/uL — ABNORMAL HIGH (ref 4.0–10.5)
nRBC: 0 % (ref 0.0–0.2)

## 2023-06-05 LAB — COMPREHENSIVE METABOLIC PANEL
ALT: 19 U/L (ref 0–44)
AST: 26 U/L (ref 15–41)
Albumin: 2.9 g/dL — ABNORMAL LOW (ref 3.5–5.0)
Alkaline Phosphatase: 91 U/L (ref 38–126)
Anion gap: 12 (ref 5–15)
BUN: 9 mg/dL (ref 6–20)
CO2: 17 mmol/L — ABNORMAL LOW (ref 22–32)
Calcium: 8.7 mg/dL — ABNORMAL LOW (ref 8.9–10.3)
Chloride: 107 mmol/L (ref 98–111)
Creatinine, Ser: 0.79 mg/dL (ref 0.44–1.00)
GFR, Estimated: >60 mL/min (ref >60)
Glucose, Bld: 69 mg/dL — ABNORMAL LOW (ref 70–99)
Potassium: 3.6 mmol/L (ref 3.5–5.1)
Sodium: 136 mmol/L (ref 135–145)
Total Bilirubin: 0.9 mg/dL (ref <1.2)
Total Protein: 6.8 g/dL (ref 6.5–8.1)

## 2023-06-05 LAB — URINALYSIS, ROUTINE W REFLEX MICROSCOPIC
Bilirubin Urine: NEGATIVE
Glucose, UA: NEGATIVE mg/dL
Hgb urine dipstick: NEGATIVE
Ketones, ur: 80 mg/dL — AB
Nitrite: NEGATIVE
Protein, ur: 30 mg/dL — AB
Specific Gravity, Urine: 1.03 (ref 1.005–1.030)
pH: 5 (ref 5.0–8.0)

## 2023-06-05 MED ORDER — SODIUM CHLORIDE 0.9 % IV SOLN
12.5000 mg | Freq: Once | INTRAVENOUS | Status: AC
  Administered 2023-06-05: 12.5 mg via INTRAVENOUS
  Filled 2023-06-05: qty 12.5

## 2023-06-05 MED ORDER — DEXTROSE-SODIUM CHLORIDE 5-0.45 % IV SOLN: Freq: Once | INTRAVENOUS | Status: AC

## 2023-06-05 MED ORDER — ONDANSETRON 4 MG PO TBDP: 4.0000 mg | ORAL_TABLET | Freq: Four times a day (QID) | ORAL | 0 refills | Status: DC | PRN

## 2023-06-05 MED ORDER — PROMETHAZINE HCL 25 MG PO TABS: 25.0000 mg | ORAL_TABLET | Freq: Four times a day (QID) | ORAL | 2 refills | Status: DC | PRN

## 2023-06-05 NOTE — Telephone Encounter (Signed)
RTC. Pt reports continued N/V and now diarrhea. Reports unable to keep anything down. Has not tried nausea med because unable to keep down water. Pt reports her mouth is very dry and she feels dizzy at times. Reports low pelvic pressure esp with vomiting. Reports good FM. Advised pt to seek care in MAU due to concern for dehydration in pregnancy. Instructions and address for MAU given. Pt verbalized understanding.

## 2023-06-05 NOTE — MAU Note (Signed)
..  Kellie Wilson is a 27 y.o. at [redacted]w[redacted]d here in MAU reporting: can't keep anything down since her appointment. Has vomited 10 times. Reports tightening in the bottom of her stomach but not painful and triggered by coughing.  Denies vaginal bleeding or leaking of fluid.  Pain score: 0/10 Vitals:   06/05/23 1946  BP: 104/69  Pulse: 66  Resp: 17  Temp: 98.4 F (36.9 C)  SpO2: 100%     FHT:154 Lab orders placed from triage: UA

## 2023-06-05 NOTE — Progress Notes (Addendum)
   PRENATAL VISIT NOTE  Subjective:  Klover Morss is a 27 y.o. [id number] at [redacted]w[redacted]d being seen today for ongoing prenatal care.  She is currently monitored for the following issues for this low-risk pregnancy and has Supervision of other normal pregnancy, antepartum; History of poor fetal growth; Alpha thalassemia silent carrier; and Obesity affecting pregnancy, antepartum on their problem list.  Patient reports reports some nausea Contractions: Not present. Vag. Bleeding: None.  Movement: Present. Denies leaking of fluid.   The following portions of the patient's history were reviewed and updated as appropriate: allergies, current medications, past family history, past medical history, past social history, past surgical history and problem list.   Objective:   Vitals:   06/05/23 0837  BP: 122/78  Pulse: (!) 103  Weight: 205 lb 3.2 oz (93.1 kg)    Fetal Status: Fetal Heart Rate (bpm): 144 Fundal Height: 22 cm Movement: Present     General:  Alert, oriented and cooperative. Patient is in no acute distress.  Skin: Skin is warm and dry. No rash noted.   Cardiovascular: Normal heart rate noted  Respiratory: Normal respiratory effort, no problems with respiration noted  Abdomen: Soft, gravid, appropriate for gestational age.  Pain/Pressure: Present     Pelvic: Cervical exam deferred        Extremities: Normal range of motion.  Edema: None  Mental Status: Normal mood and affect. Normal behavior. Normal judgment and thought content.   Assessment and Plan:  Pregnancy: [id number] at [redacted]w[redacted]d 1. Supervision of other normal pregnancy, antepartum (Primary) BP and FHR normal Doing well, feeling regular movement  FH appropriate  2. History of poor fetal growth 12/2 u/s EFW 56%, afi normal, follow up growth scheduled 1/7  3. [redacted] weeks gestation of pregnancy Anticipatory guidance regarding upcoming appt and GTT Will be scheduling waterbirth class after holidays  4. Nausea and vomiting during  pregnancy Rx sent for PRN use  - ondansetron (ZOFRAN-ODT) 4 MG disintegrating tablet; Take 1 tablet (4 mg total) by mouth every 6 (six) hours as needed for nausea.  Dispense: 20 tablet; Refill: 0  Preterm labor symptoms and general obstetric precautions including but not limited to vaginal bleeding, contractions, leaking of fluid and fetal movement were reviewed in detail with the patient. Please refer to After Visit Summary for other counseling recommendations.   Return in about 4 weeks (around 07/03/2023) for OB VISIT (MD or APP), 2 hr GTT.  Future Appointments  Date Time Provider Department Center  06/20/2023  8:15 AM [facility] NURSE [facility]  06/20/2023  8:30 AM [facility]    Albertine Grates, FNP

## 2023-06-05 NOTE — MAU Provider Note (Signed)
Chief Complaint:  Emesis and Nausea   Event Date/Time   First Provider Initiated Contact with Patient 06/05/23 2048     HPI: Kellie Wilson is a 27 y.o. [id number] at 32w4dwho presents to maternity admissions reporting vomiting all day.  Unable to keep down anything   also has had some pelvic cramping for 2 months.  States was told it was normal but she is concerned it is preterm labor.  Has never had cervix exam. She reports good fetal movement, denies LOF, vaginal bleeding, urinary symptoms, diarrhea, constipation or fever/chills.    Emesis  This is a recurrent problem. The problem occurs more than 10 times per day. The problem has been unchanged. There has been no fever. Pertinent negatives include no abdominal pain or chills. She has tried nothing for the symptoms.   RN Note: Kellie Wilson is a 27 y.o. at [redacted]w[redacted]d here in MAU reporting: can't keep anything down since her appointment. Has vomited 10 times. Reports tightening in the bottom of her stomach but not painful and triggered by coughing.  Denies vaginal bleeding or leaking of fluid.  Pain score: 0/10  Past Medical History: Past Medical History:  Diagnosis Date   Medical history non-contributory     Past obstetric history: OB History  Gravida Para Term Preterm AB Living  5 2 2  0 2 2  SAB IAB Ectopic Multiple Live Births  0 2 0 0 2    # Outcome Date GA Lbr Len/2nd Weight Sex Type Anes PTL Lv  5 Current           4 IAB 2024          3 IAB 2024          2 Term 03/12/21    M Vag-Spont   LIV  1 Term 01/27/16 [redacted]w[redacted]d    Vag-Spont   LIV    Past Surgical History: Past Surgical History:  Procedure Laterality Date   NO PAST SURGERIES      Family History: Family History  Problem Relation Age of Onset   Diabetes Maternal Grandmother     Social History: Social History   Tobacco Use   Smoking status: Never   Smokeless tobacco: Never  Vaping Use   Vaping status: Former   Quit date: 12/26/2022  Substance Use Topics    Alcohol use: No   Drug use: No    Allergies: No Known Allergies  Meds:  Medications Prior to Admission  Medication Sig Dispense Refill Last Dose/Taking   aspirin EC 81 MG tablet Take 81 mg by mouth daily. Swallow whole. (Patient not taking: Reported on 03/27/2023)      Blood Pressure Monitoring (BLOOD PRESSURE KIT) DEVI 1 Device by Does not apply route once a week. (Patient not taking: Reported on 06/05/2023) 1 each 0    metroNIDAZOLE (FLAGYL) 500 MG tablet Take 1 tablet (500 mg total) by mouth 2 (two) times daily. (Patient not taking: Reported on 06/05/2023) 14 tablet 0    metroNIDAZOLE (FLAGYL) 500 MG tablet Take 1 tablet (500 mg total) by mouth 2 (two) times daily. (Patient not taking: Reported on 06/05/2023) 14 tablet 0    ondansetron (ZOFRAN-ODT) 4 MG disintegrating tablet Take 1 tablet (4 mg total) by mouth every 6 (six) hours as needed for nausea. 20 tablet 0    Prenatal Vit-Fe Fumarate-FA (PRENATAL MULTIVITAMIN) TABS tablet Take 1 tablet by mouth daily at 12 noon.      terconazole (TERAZOL 7) 0.4 % vaginal cream Place 1 applicator  vaginally at bedtime. (Patient not taking: Reported on 06/05/2023) 45 g 0     I have reviewed patient's Past Medical Hx, Surgical Hx, Family Hx, Social Hx, medications and allergies.   ROS:  Review of Systems  Constitutional:  Negative for chills.  Gastrointestinal:  Positive for vomiting. Negative for abdominal pain.   Other systems negative  Physical Exam  Patient Vitals for the past 24 hrs:  BP Temp Temp src Pulse Resp SpO2 Height Weight  06/05/23 1946 104/69 98.4 F (36.9 C) Oral 66 17 100 % 5\' 4"  (1.626 m) 92.4 kg   Constitutional: Well-developed, well-nourished female in no acute distress.  Cardiovascular: normal rate  Respiratory: normal effort GI: Abd soft, non-tender, gravid appropriate for gestational age.   No rebound or guarding. MS: Extremities nontender, no edema, normal ROM Neurologic: Alert and oriented x 4.  GU: Neg  CVAT.  PELVIC EXAM: Cervix long and closed   FHT:   154  Labs: Results for orders placed or performed during the hospital encounter of 06/05/23 (from the past 24 hours)  Urinalysis, Routine w reflex microscopic -Urine, Clean Catch     Status: Abnormal   Collection Time: 06/05/23  7:56 PM  Result Value Ref Range   Color, Urine YELLOW YELLOW   APPearance HAZY (A) CLEAR   Specific Gravity, Urine 1.030 1.005 - 1.030   pH 5.0 5.0 - 8.0   Glucose, UA NEGATIVE NEGATIVE mg/dL   Hgb urine dipstick NEGATIVE NEGATIVE   Bilirubin Urine NEGATIVE NEGATIVE   Ketones, ur 80 (A) NEGATIVE mg/dL   Protein, ur 30 (A) NEGATIVE mg/dL   Nitrite NEGATIVE NEGATIVE   Leukocytes,Ua TRACE (A) NEGATIVE   RBC / HPF 0-5 0 - 5 RBC/hpf   WBC, UA 0-5 0 - 5 WBC/hpf   Bacteria, UA RARE (A) NONE SEEN   Squamous Epithelial / HPF 6-10 0 - 5 /HPF   Mucus PRESENT   CBC     Status: Abnormal   Collection Time: 06/05/23  9:10 PM  Result Value Ref Range   WBC 11.7 (H) 4.0 - 10.5 K/uL   RBC 3.89 3.87 - 5.11 MIL/uL   Hemoglobin 11.1 (L) 12.0 - 15.0 g/dL   HCT 16.1 (L) 09.6 - 04.5 %   MCV 88.7 80.0 - 100.0 fL   MCH 28.5 26.0 - 34.0 pg   MCHC 32.2 30.0 - 36.0 g/dL   RDW 40.9 81.1 - 91.4 %   Platelets 217 150 - 400 K/uL   nRBC 0.0 0.0 - 0.2 %  Comprehensive metabolic panel     Status: Abnormal   Collection Time: 06/05/23  9:10 PM  Result Value Ref Range   Sodium 136 135 - 145 mmol/L   Potassium 3.6 3.5 - 5.1 mmol/L   Chloride 107 98 - 111 mmol/L   CO2 17 (L) 22 - 32 mmol/L   Glucose, Bld 69 (L) 70 - 99 mg/dL   BUN 9 6 - 20 mg/dL   Creatinine, Ser 7.82 0.44 - 1.00 mg/dL   Calcium 8.7 (L) 8.9 - 10.3 mg/dL   Total Protein 6.8 6.5 - 8.1 g/dL   Albumin 2.9 (L) 3.5 - 5.0 g/dL   AST 26 15 - 41 U/L   ALT 19 0 - 44 U/L   Alkaline Phosphatase 91 38 - 126 U/L   Total Bilirubin 0.9 <1.2 mg/dL   GFR, Estimated >95 >62 mL/min   Anion gap 12 5 - 15    A/Positive/-- (09/30 1100)  Imaging:  MAU Course/MDM: I have  reviewed the triage vital signs and the nursing notes.   Pertinent labs & imaging results that were available during my care of the patient were reviewed by me and considered in my medical decision making (see chart for details).      I have reviewed her medical records including past results, notes and treatments.   I have ordered labs and reviewed results. These are normal   Treatments in MAU included IV hydration with Phenergan for nausea. She got good relief and was able to tolerate PO intake.    Assessment: Single IUP at [redacted]w[redacted]d Nausea, vomiting and diarrhea Gastroenteritis  Plan: Discharge home Supportive care Refilled zofran Rx Phenergan for nausea Followup in office  Preterm Labor precautions and fetal kick counts Encouraged to return if she develops worsening of symptoms, increase in pain, fever, or other concerning symptoms.      Pt stable at time of discharge.  Wynelle Bourgeois CNM, MSN Certified Nurse-Midwife 06/05/2023 8:48 PM

## 2023-06-05 NOTE — Progress Notes (Signed)
Pt presents for ROB visit. Pt c/o n/v and abd pressure

## 2023-06-05 NOTE — Addendum Note (Signed)
Addended by: Sue Lush on: 06/05/2023 09:45 AM   Modules accepted: Orders

## 2023-06-14 NOTE — L&D Delivery Note (Addendum)
 OB/GYN Faculty Practice Delivery Note  Kellie Wilson is a 28 y.o. [id number] s/p SVD at [redacted]w[redacted]d. She was admitted for SOL.   ROM: 1h 15m with clear fluid GBS Status: Positive, treated with PCN for prophylaxis Maximum Maternal Temperature: 97.93F  Labor Progress: Patient began contracting at 0400 this morning, presented to MAU, and her cervix changed from 2-4 cm, and she was requesting an epidural. She was admitted to the L&D unit, got her epidural, and received PCNx2. AROM was performed at 1735 with clear fluid. She progress to 10 cm.  Delivery Date/Time: 09/20/23 at 1836 Delivery: Called to room and entered the room as the baby was delivering spontaneously with RN. Head delivered in ROA position. Nuchal cord x1 present and reduced. Shoulder and body delivered in usual fashion. Infant with spontaneous cry, placed on mother's abdomen, dried and stimulated. Cord clamped x 2 after delayed cord clamping, and cut by father of the baby. Cord blood drawn and collected. Placenta delivered spontaneously with gentle cord traction. Fundus firm with massage and Pitocin. Labia, perineum, vagina, and cervix inspected inspected with vaginal sweep and bilateral labial lacerations were noted but not parallel, they were hemostatic so no repair was needed.   Placenta: delivered spontaneously, patient plans to take her placenta home once she has obtained a cooler Complications: nuchal cord x1, reduced Lacerations: bilateral superficial labial lacerations, not parallel, hemostatic, not repaired EBL: 385 Analgesia: epidural  Postpartum Planning [X]  message to sent to schedule follow-up  [X]  postpartum orders placed [X]  list updated  Infant: female  APGARs 8/9  weight pending  Iantha Fallen, Student-MidWife  09/20/2023 7:18 PM   Midwife attestation: I was gloved and present for delivery in its entirety and I agree with the above midwife student's note.  Sharen Counter, CNM 8:17 PM

## 2023-06-19 ENCOUNTER — Ambulatory Visit: Payer: Self-pay

## 2023-06-20 ENCOUNTER — Ambulatory Visit: Payer: Medicaid Other | Attending: Maternal & Fetal Medicine

## 2023-06-20 ENCOUNTER — Ambulatory Visit: Payer: Medicaid Other

## 2023-07-03 ENCOUNTER — Encounter: Payer: Self-pay | Admitting: Advanced Practice Midwife

## 2023-07-03 ENCOUNTER — Ambulatory Visit (INDEPENDENT_AMBULATORY_CARE_PROVIDER_SITE_OTHER): Payer: Medicaid Other | Admitting: Advanced Practice Midwife

## 2023-07-03 VITALS — BP 127/70 | HR 88 | Wt 212.0 lb

## 2023-07-03 DIAGNOSIS — Z3A26 26 weeks gestation of pregnancy: Secondary | ICD-10-CM

## 2023-07-03 DIAGNOSIS — Z87898 Personal history of other specified conditions: Secondary | ICD-10-CM

## 2023-07-03 DIAGNOSIS — Z3493 Encounter for supervision of normal pregnancy, unspecified, third trimester: Secondary | ICD-10-CM

## 2023-07-03 NOTE — Progress Notes (Signed)
.    PRENATAL VISIT NOTE  Subjective:  Kellie Wilson is a 28 y.o. [id number] at [redacted]w[redacted]d being seen today for ongoing prenatal care.  She is currently monitored for the following issues for this low-risk pregnancy and has Supervision of other normal pregnancy, antepartum; History of poor fetal growth; Alpha thalassemia silent carrier; and Obesity affecting pregnancy, antepartum on their problem list.  Patient reports no complaints.  Contractions: Not present. Vag. Bleeding: None.  Movement: Present. Denies leaking of fluid.   The following portions of the patient's history were reviewed and updated as appropriate: allergies, current medications, past family history, past medical history, past social history, past surgical history and problem list.   Objective:   Vitals:   07/03/23 0823  BP: 127/70  Pulse: 88  Weight: 212 lb (96.2 kg)    Fetal Status: Fetal Heart Rate (bpm): 142   Movement: Present    Fundal height 27  General:  Alert, oriented and cooperative. Patient is in no acute distress.  Skin: Skin is warm and dry. No rash noted.   Cardiovascular: Normal heart rate noted  Respiratory: Normal respiratory effort, no problems with respiration noted  Abdomen: Soft, gravid, appropriate for gestational age.  Pain/Pressure: Present     Pelvic: Cervical exam deferred        Extremities: Normal range of motion.  Edema: None  Mental Status: Normal mood and affect. Normal behavior. Normal judgment and thought content.   Assessment and Plan:  Pregnancy: [id number] at [redacted]w[redacted]d There are no diagnoses linked to this encounter. Preterm labor symptoms and general obstetric precautions including but not limited to vaginal bleeding, contractions, leaking of fluid and fetal movement were reviewed in detail with the patient. Please refer to After Visit Summary for other counseling recommendations.     Future Appointments  Date Time Provider Department Center  07/17/2023  8:15 AM [facility], Wilmer Floor, CNM  [facility] None  07/17/2023  8:45 AM [facility] LAB [facility] None  07/31/2023  8:15 AM Leftwich-Kirby, Wilmer Floor, CNM [facility] None     Post placental IUD - Desires ParaGard Placenta - would like to take home and she plans to bring her cooler with ice  Declines water birth, now favors epidural  GTT next visit 4 weeks RTO  1. Encounter for supervision of low-risk pregnancy in third trimester (Primary) GTT NV  2. [redacted] weeks gestation of pregnancy   3. History of poor fetal growth F/U to be scheduled last  sono missed- message sent to MFM  Marcell Barlow, MSN, [facility], Center for [facility]

## 2023-07-03 NOTE — Progress Notes (Signed)
Pt presents for ROB visit. Considering IUD for [facility].

## 2023-07-17 ENCOUNTER — Encounter: Payer: Self-pay | Admitting: Obstetrics & Gynecology

## 2023-07-17 ENCOUNTER — Other Ambulatory Visit: Payer: Medicaid Other

## 2023-07-17 ENCOUNTER — Ambulatory Visit (INDEPENDENT_AMBULATORY_CARE_PROVIDER_SITE_OTHER): Payer: Medicaid Other | Admitting: Obstetrics & Gynecology

## 2023-07-17 VITALS — BP 114/75 | HR 69 | Wt 210.0 lb

## 2023-07-17 DIAGNOSIS — Z87898 Personal history of other specified conditions: Secondary | ICD-10-CM

## 2023-07-17 DIAGNOSIS — Z3480 Encounter for supervision of other normal pregnancy, unspecified trimester: Secondary | ICD-10-CM

## 2023-07-17 DIAGNOSIS — O99210 Obesity complicating pregnancy, unspecified trimester: Secondary | ICD-10-CM

## 2023-07-17 DIAGNOSIS — Z23 Encounter for immunization: Secondary | ICD-10-CM

## 2023-07-17 DIAGNOSIS — Z3A28 28 weeks gestation of pregnancy: Secondary | ICD-10-CM

## 2023-07-17 DIAGNOSIS — D563 Thalassemia minor: Secondary | ICD-10-CM

## 2023-07-17 NOTE — Progress Notes (Signed)
   PRENATAL VISIT NOTE  Subjective:  Kellie Wilson is a 28 y.o. [phone] (2 and 36 yo kids) at [redacted]w[redacted]d being seen today for ongoing prenatal care.  She is currently monitored for the following issues for this low-risk pregnancy and has Supervision of other normal pregnancy, antepartum; History of poor fetal growth; Alpha thalassemia silent carrier; and Obesity affecting pregnancy, antepartum on their problem list.  Patient reports no complaints.   .  .   . Denies leaking of fluid.   The following portions of the patient's history were reviewed and updated as appropriate: allergies, current medications, past family history, past medical history, past social history, past surgical history and problem list.   Objective:  There were no vitals filed for this visit.  Fetal Status:           FH- 29 cm FHR- 130s  General:  Alert, oriented and cooperative. Patient is in no acute distress.  Skin: Skin is warm and dry. No rash noted.   Cardiovascular: Normal heart rate noted  Respiratory: Normal respiratory effort, no problems with respiration noted  Abdomen: Soft, gravid, appropriate for gestational age.        Pelvic: Cervical exam deferred        Extremities: Normal range of motion.     Mental Status: Normal mood and affect. Normal behavior. Normal judgment and thought content.   Assessment and Plan:  Pregnancy: [id number] at [redacted]w[redacted]d 1. Obesity affecting pregnancy, antepartum, unspecified obesity type (Primary)  2. Alpha thalassemia silent carrier  3. History of poor fetal growth   4. Supervision of other normal pregnancy, antepartum  - Glucose Tolerance, 2 Hours w/1 Hour - CBC - HIV antibody (with reflex) - RPR - Flu vaccine trivalent PF, 6mos and older(Flulaval,Afluria,Fluarix,Fluzone)  - TDAP at next visit  5. [redacted] weeks gestation of pregnancy  - Glucose Tolerance, 2 Hours w/1 Hour - CBC - HIV antibody (with reflex) - RPR - Flu vaccine trivalent PF, 6mos and  older(Flulaval,Afluria,Fluarix,Fluzone)  Preterm labor symptoms and general obstetric precautions including but not limited to vaginal bleeding, contractions, leaking of fluid and fetal movement were reviewed in detail with the patient. Please refer to After Visit Summary for other counseling recommendations.   Return in about 3 weeks (around 08/07/2023). For TDAP  Future Appointments  Date Time Provider Department Center  07/31/2023  8:15 AM Leftwich-Kirby, Wilmer Floor, CNM [facility] None    Allie Bossier, MD

## 2023-07-18 ENCOUNTER — Encounter: Payer: Self-pay | Admitting: Obstetrics & Gynecology

## 2023-07-18 LAB — CBC
Hematocrit: 31.8 % — ABNORMAL LOW (ref 34.0–46.6)
Hemoglobin: 10.2 g/dL — ABNORMAL LOW (ref 11.1–15.9)
MCH: 28.4 pg (ref 26.6–33.0)
MCHC: 32.1 g/dL (ref 31.5–35.7)
MCV: 89 fL (ref 79–97)
Platelets: 246 10*3/uL (ref 150–450)
RBC: 3.59 x10E6/uL — ABNORMAL LOW (ref 3.77–5.28)
RDW: 11.8 % (ref 11.7–15.4)
WBC: 9.9 10*3/uL (ref 3.4–10.8)

## 2023-07-18 LAB — GLUCOSE TOLERANCE, 2 HOURS W/ 1HR
Glucose, 1 hour: 122 mg/dL (ref 70–179)
Glucose, 2 hour: 86 mg/dL (ref 70–152)
Glucose, Fasting: 70 mg/dL (ref 70–91)

## 2023-07-18 LAB — HIV ANTIBODY (ROUTINE TESTING W REFLEX): HIV Screen 4th Generation wRfx: NONREACTIVE

## 2023-07-18 LAB — RPR: RPR Ser Ql: NONREACTIVE

## 2023-07-28 ENCOUNTER — Telehealth: Payer: Self-pay

## 2023-07-28 NOTE — Telephone Encounter (Signed)
Returned call, advised rx faxed to [facility]

## 2023-07-31 ENCOUNTER — Encounter: Payer: Self-pay | Admitting: Advanced Practice Midwife

## 2023-07-31 ENCOUNTER — Ambulatory Visit: Payer: Medicaid Other | Admitting: Advanced Practice Midwife

## 2023-07-31 VITALS — BP 115/63 | HR 88 | Wt 214.8 lb

## 2023-07-31 DIAGNOSIS — Z3480 Encounter for supervision of other normal pregnancy, unspecified trimester: Secondary | ICD-10-CM

## 2023-07-31 DIAGNOSIS — O99613 Diseases of the digestive system complicating pregnancy, third trimester: Secondary | ICD-10-CM

## 2023-07-31 DIAGNOSIS — O26893 Other specified pregnancy related conditions, third trimester: Secondary | ICD-10-CM

## 2023-07-31 DIAGNOSIS — K5900 Constipation, unspecified: Secondary | ICD-10-CM

## 2023-07-31 DIAGNOSIS — R102 Pelvic and perineal pain: Secondary | ICD-10-CM

## 2023-07-31 DIAGNOSIS — Z3A30 30 weeks gestation of pregnancy: Secondary | ICD-10-CM

## 2023-07-31 DIAGNOSIS — Z87898 Personal history of other specified conditions: Secondary | ICD-10-CM

## 2023-07-31 MED ORDER — DOCUSATE SODIUM 100 MG PO CAPS: 100.0000 mg | ORAL_CAPSULE | Freq: Two times a day (BID) | ORAL | 0 refills | Status: DC | PRN

## 2023-07-31 NOTE — Progress Notes (Signed)
   PRENATAL VISIT NOTE  Subjective:  Kellie Wilson is a 28 y.o. [id number] at [redacted]w[redacted]d being seen today for ongoing prenatal care.  She is currently monitored for the following issues for this low-risk pregnancy and has Supervision of other normal pregnancy, antepartum; History of poor fetal growth; Alpha thalassemia silent carrier; and Obesity affecting pregnancy, antepartum on their problem list.  Patient reports  pelvic pressure and constipation .  Contractions: Irritability. Vag. Bleeding: None.  Movement: Present. Denies leaking of fluid.   The following portions of the patient's history were reviewed and updated as appropriate: allergies, current medications, past family history, past medical history, past social history, past surgical history and problem list.   Objective:   Vitals:   07/31/23 0823  BP: 115/63  Pulse: 88  Weight: 214 lb 12.8 oz (97.4 kg)    Fetal Status: Fetal Heart Rate (bpm): 133   Movement: Present     General:  Alert, oriented and cooperative. Patient is in no acute distress.  Skin: Skin is warm and dry. No rash noted.   Cardiovascular: Normal heart rate noted  Respiratory: Normal respiratory effort, no problems with respiration noted  Abdomen: Soft, gravid, appropriate for gestational age.  Pain/Pressure: Present     Pelvic: Cervical exam deferred        Extremities: Normal range of motion.  Edema: None  Mental Status: Normal mood and affect. Normal behavior. Normal judgment and thought content.   Assessment and Plan:  Pregnancy: A5W0981 at [redacted]w[redacted]d 1. Supervision of other normal pregnancy, antepartum (Primary) --Anticipatory guidance about next visits/weeks of pregnancy given.   2. History of poor fetal growth --EFW 56% at anatomy US, F/U growth [place] ordered  3. [redacted] weeks gestation of pregnancy   4. Pelvic pressure in pregnancy, antepartum, third trimester  --Rest/ice/heat/warm bath/increase PO fluids/Tylenol/pregnancy support belt    5. Constipation  during pregnancy in third trimester --Increase PO fluids and fiber --Pt afraid to strain with pelvic pressure in pregnancy, reassurance provided after cervical exam today --Colace BID PRN  Preterm labor symptoms and general obstetric precautions including but not limited to vaginal bleeding, contractions, leaking of fluid and fetal movement were reviewed in detail with the patient. Please refer to After Visit Summary for other counseling recommendations.   Return in about 2 weeks (around 08/14/2023) for Midwife preferred.  Future Appointments  Date Time Provider Department Center  08/08/2023  9:15 AM [facility] NURSE [facility]  08/08/2023  9:30 AM [facility] US7 [facility]  08/15/2023  8:35 AM Leftwich-Kirby, Wilmer Floor, CNM [facility] None  08/28/2023  8:35 AM Leftwich-Kirby, Wilmer Floor, CNM [facility] None  09/12/2023  8:35 AM Leftwich-Kirby, Wilmer Floor, CNM [facility] None  09/19/2023  8:35 AM Leftwich-Kirby, Wilmer Floor, CNM [facility] None  09/25/2023  8:35 AM Leftwich-Kirby, Wilmer Floor, CNM [facility] None  10/03/2023  8:35 AM Leftwich-Kirby, Wilmer Floor, CNM [facility] None  10/09/2023  8:35 AM Leftwich-Kirby, Wilmer Floor, CNM [facility] None    Sharen Counter, CNM

## 2023-07-31 NOTE — Progress Notes (Signed)
 Pt presents for ROB visit. Pt c/o pain and pressure and constipation.

## 2023-08-08 ENCOUNTER — Other Ambulatory Visit: Payer: Self-pay | Admitting: Maternal & Fetal Medicine

## 2023-08-08 ENCOUNTER — Ambulatory Visit: Payer: Medicaid Other | Attending: Maternal & Fetal Medicine

## 2023-08-08 ENCOUNTER — Other Ambulatory Visit: Payer: Self-pay | Admitting: *Deleted

## 2023-08-08 ENCOUNTER — Ambulatory Visit: Payer: Medicaid Other | Attending: Obstetrics | Admitting: Obstetrics

## 2023-08-08 ENCOUNTER — Ambulatory Visit: Payer: Medicaid Other

## 2023-08-08 DIAGNOSIS — Z8759 Personal history of other complications of pregnancy, childbirth and the puerperium: Secondary | ICD-10-CM | POA: Diagnosis present

## 2023-08-08 DIAGNOSIS — Z3A31 31 weeks gestation of pregnancy: Secondary | ICD-10-CM

## 2023-08-08 DIAGNOSIS — O365990 Maternal care for other known or suspected poor fetal growth, unspecified trimester, not applicable or unspecified: Secondary | ICD-10-CM

## 2023-08-08 DIAGNOSIS — E669 Obesity, unspecified: Secondary | ICD-10-CM | POA: Diagnosis not present

## 2023-08-08 DIAGNOSIS — O365931 Maternal care for other known or suspected poor fetal growth, third trimester, fetus 1: Secondary | ICD-10-CM | POA: Diagnosis present

## 2023-08-08 DIAGNOSIS — O99210 Obesity complicating pregnancy, unspecified trimester: Secondary | ICD-10-CM | POA: Diagnosis present

## 2023-08-08 DIAGNOSIS — O99013 Anemia complicating pregnancy, third trimester: Secondary | ICD-10-CM

## 2023-08-08 DIAGNOSIS — O99213 Obesity complicating pregnancy, third trimester: Secondary | ICD-10-CM

## 2023-08-08 DIAGNOSIS — O365930 Maternal care for other known or suspected poor fetal growth, third trimester, not applicable or unspecified: Secondary | ICD-10-CM

## 2023-08-08 DIAGNOSIS — O365920 Maternal care for other known or suspected poor fetal growth, second trimester, not applicable or unspecified: Secondary | ICD-10-CM

## 2023-08-08 DIAGNOSIS — D563 Thalassemia minor: Secondary | ICD-10-CM

## 2023-08-08 NOTE — Progress Notes (Signed)
 MFM Consult Note  Kellie Wilson is currently at 31 weeks and 5 days.  She has been followed due to a history of IUGR in her prior pregnancy.    She denies any problems since her last exam and reports feeling vigorous fetal movements throughout the day.    On today's exam, the EFW of 3 pounds 8 ounces measures at the 10th percentile for her gestational age.  The fetal AC measures at the 5th percentile indicating IUGR.    The total AFI was 11.54 cm (within normal limits).  A BPP performed today was 8 out of 8.    Doppler studies of the umbilical arteries showed a normal S/D ratio of 3.73 .  There were no signs of absent or reversed end-diastolic flow.    The patient was reassured that IUGR is a common finding.  Most cases of IUGR result in the delivery of a healthy infant at or close to term.  Due to fetal growth restriction, we will continue to follow her with weekly fetal testing and umbilical artery Doppler studies.    We will reassess the fetal growth again in 3 weeks.    Should IUGR continue to be noted later in her pregnancy, delivery will be recommended at between 37 to 38 weeks.  She will return in 1 week for another BPP and umbilical artery Doppler study.   The patient stated that all of her questions were answered today.  A total of 20 minutes was spent counseling and coordinating the care for this patient.  Greater than 50% of the time was spent in direct face-to-face contact.

## 2023-08-15 ENCOUNTER — Encounter: Payer: Medicaid Other | Admitting: Advanced Practice Midwife

## 2023-08-15 NOTE — Progress Notes (Deleted)
   PRENATAL VISIT NOTE  Subjective:  Kellie Wilson is a 28 y.o. [id number] at [redacted]w[redacted]d being seen today for ongoing prenatal care.  She is currently monitored for the following issues for this {Blank single:19197::"high-risk","low-risk"} pregnancy and has Supervision of other normal pregnancy, antepartum; History of poor fetal growth; Alpha thalassemia silent carrier; and Obesity affecting pregnancy, antepartum on their problem list.  Patient reports {sx:[id number]}.   .  .   . Denies leaking of fluid.   The following portions of the patient's history were reviewed and updated as appropriate: allergies, current medications, past family history, past medical history, past social history, past surgical history and problem list.   Objective:  There were no vitals filed for this visit.  Fetal Status:           General:  Alert, oriented and cooperative. Patient is in no acute distress.  Skin: Skin is warm and dry. No rash noted.   Cardiovascular: Normal heart rate noted  Respiratory: Normal respiratory effort, no problems with respiration noted  Abdomen: Soft, gravid, appropriate for gestational age.        Pelvic: {Blank single:19197::"Cervical exam performed in the presence of a chaperone","Cervical exam deferred"}        Extremities: Normal range of motion.     Mental Status: Normal mood and affect. Normal behavior. Normal judgment and thought content.   Assessment and Plan:  Pregnancy: A5W0981 at [redacted]w[redacted]d 1. Supervision of other normal pregnancy, antepartum (Primary) ***  2. Pelvic pressure in pregnancy, antepartum, third trimester ***  3. [redacted] weeks gestation of pregnancy ***  {Blank single:19197::"Term","Preterm"} labor symptoms and general obstetric precautions including but not limited to vaginal bleeding, contractions, leaking of fluid and fetal movement were reviewed in detail with the patient. Please refer to After Visit Summary for other counseling recommendations.   No follow-ups on  file.  Future Appointments  Date Time Provider Department Center  08/15/2023  8:35 AM Hurshel Party, CNM [facility] None  08/24/2023 11:30 AM [facility] US6 [facility]  08/28/2023  8:35 AM Leftwich-Kirby, Wilmer Floor, CNM [facility] None  08/31/2023  3:30 PM [facility] US5 [facility]  09/07/2023 11:30 AM [facility] US5 [facility]  09/12/2023  8:35 AM Leftwich-Kirby, Wilmer Floor, CNM [facility] None  09/19/2023  8:35 AM Leftwich-Kirby, Wilmer Floor, CNM [facility] None  09/25/2023  8:35 AM Leftwich-Kirby, Wilmer Floor, CNM [facility] None  10/03/2023  8:35 AM Leftwich-Kirby, Wilmer Floor, CNM [facility] None  10/09/2023  8:35 AM Leftwich-Kirby, Wilmer Floor, CNM [facility] None    Sharen Counter, CNM

## 2023-08-17 NOTE — Progress Notes (Addendum)
   PRENATAL VISIT NOTE  Subjective:  Kellie Wilson is a 28 y.o. [id number] at [redacted]w[redacted]d being seen today for ongoing prenatal care.  She is currently monitored for the following issues for this low-risk pregnancy and has Supervision of other normal pregnancy, antepartum; History of poor fetal growth; Alpha thalassemia silent carrier; and Obesity affecting pregnancy, antepartum on their problem list.  Patient reports milky white vaginal discharge without itching, irritation, malodor that she feels is normal for her. She would not like to have swabs done today.   Denies leaking of fluid, vaginal bleeding, contractions.  Endorses regular fetal movement.  The following portions of the patient's history were reviewed and updated as appropriate: allergies, current medications, past family history, past medical history, past social history, past surgical history and problem list.   Objective:   Vitals:   08/18/23 0849  BP: 115/70  Pulse: 90  Weight: 223 lb 9.6 oz (101.4 kg)    Fetal Status: Fetal Heart Rate (bpm): 137   Movement: Present     General:  Alert, oriented and cooperative. Patient is in no acute distress.  Skin: Skin is warm and dry. No rash noted.   Cardiovascular: Normal heart rate noted  Respiratory: Normal respiratory effort, no problems with respiration noted  Abdomen: Soft, gravid, appropriate for gestational age.  Pain/Pressure: Present     Pelvic: Cervical exam deferred        Extremities: Normal range of motion.  Edema: None  Mental Status: Normal mood and affect. Normal behavior. Normal judgment and thought content.   Assessment and Plan:  Pregnancy: [id number] at [redacted]w[redacted]d 1. Supervision of other normal pregnancy, antepartum (Primary) Patient is doing well, feeling regular fetal movement BP, FHR, FH appropriate  2. [redacted] weeks gestation of pregnancy Anticipatory guidance about next visits/weeks of pregnancy given.   3. Poor fetal growth affecting management of mother in third  trimester, single or unspecified fetus 4. History of poor fetal growth EFW 10%, fetal AC 5% by 08/08/2023 growth scan, normal doppler, BPP 8/8 Weekly BPP and UAD studies Serial growth [place]  5. Alpha thalassemia silent carrier Discussed FOB testing  6. Obesity affecting pregnancy, antepartum, unspecified obesity type Normal 2hr GTT  Preterm labor symptoms and general obstetric precautions including but not limited to vaginal bleeding, contractions, leaking of fluid and fetal movement were reviewed in detail with the patient.  Please refer to After Visit Summary for other counseling recommendations.   Return in about 2 weeks (around 09/01/2023) for LOB+GBS.  Future Appointments  Date Time Provider Department Center  08/24/2023 11:30 AM [facility] US6 [facility]  08/28/2023  8:35 AM Leftwich-Kirby, Wilmer Floor, CNM [facility] None  08/31/2023  3:30 PM [facility] US5 [facility]  09/07/2023 11:30 AM [facility] US5 [facility]  09/12/2023  8:35 AM Leftwich-Kirby, Wilmer Floor, CNM [facility] None  09/19/2023  8:35 AM Leftwich-Kirby, Wilmer Floor, CNM [facility] None  09/25/2023  8:35 AM Leftwich-Kirby, Wilmer Floor, CNM [facility] None  10/03/2023  8:35 AM Leftwich-Kirby, Wilmer Floor, CNM [facility] None  10/09/2023  8:35 AM Leftwich-Kirby, Wilmer Floor, CNM [facility] None    Ralene Muskrat, PA-C

## 2023-08-18 ENCOUNTER — Ambulatory Visit: Admitting: Physician Assistant

## 2023-08-18 VITALS — BP 115/70 | HR 90 | Wt 223.6 lb

## 2023-08-18 DIAGNOSIS — Z87898 Personal history of other specified conditions: Secondary | ICD-10-CM | POA: Diagnosis not present

## 2023-08-18 DIAGNOSIS — Z3A33 33 weeks gestation of pregnancy: Secondary | ICD-10-CM

## 2023-08-18 DIAGNOSIS — O365930 Maternal care for other known or suspected poor fetal growth, third trimester, not applicable or unspecified: Secondary | ICD-10-CM | POA: Diagnosis not present

## 2023-08-18 DIAGNOSIS — O99210 Obesity complicating pregnancy, unspecified trimester: Secondary | ICD-10-CM

## 2023-08-18 DIAGNOSIS — D563 Thalassemia minor: Secondary | ICD-10-CM

## 2023-08-18 DIAGNOSIS — Z3480 Encounter for supervision of other normal pregnancy, unspecified trimester: Secondary | ICD-10-CM | POA: Diagnosis not present

## 2023-08-18 NOTE — Progress Notes (Signed)
 Pt presents for rob pt complains of white milky discharge

## 2023-08-24 ENCOUNTER — Ambulatory Visit: Payer: Medicaid Other | Attending: Obstetrics

## 2023-08-24 DIAGNOSIS — Z3A34 34 weeks gestation of pregnancy: Secondary | ICD-10-CM | POA: Diagnosis not present

## 2023-08-24 DIAGNOSIS — O99213 Obesity complicating pregnancy, third trimester: Secondary | ICD-10-CM | POA: Diagnosis not present

## 2023-08-24 DIAGNOSIS — O365930 Maternal care for other known or suspected poor fetal growth, third trimester, not applicable or unspecified: Secondary | ICD-10-CM

## 2023-08-24 DIAGNOSIS — O365990 Maternal care for other known or suspected poor fetal growth, unspecified trimester, not applicable or unspecified: Secondary | ICD-10-CM | POA: Diagnosis present

## 2023-08-24 DIAGNOSIS — E669 Obesity, unspecified: Secondary | ICD-10-CM | POA: Diagnosis not present

## 2023-08-28 ENCOUNTER — Ambulatory Visit (INDEPENDENT_AMBULATORY_CARE_PROVIDER_SITE_OTHER): Payer: Medicaid Other | Admitting: Advanced Practice Midwife

## 2023-08-28 ENCOUNTER — Encounter: Payer: Self-pay | Admitting: Advanced Practice Midwife

## 2023-08-28 VITALS — BP 125/77 | HR 88 | Wt 224.0 lb

## 2023-08-28 DIAGNOSIS — Z3A34 34 weeks gestation of pregnancy: Secondary | ICD-10-CM | POA: Diagnosis not present

## 2023-08-28 DIAGNOSIS — O365990 Maternal care for other known or suspected poor fetal growth, unspecified trimester, not applicable or unspecified: Secondary | ICD-10-CM

## 2023-08-28 DIAGNOSIS — Z3480 Encounter for supervision of other normal pregnancy, unspecified trimester: Secondary | ICD-10-CM | POA: Diagnosis not present

## 2023-08-28 NOTE — Progress Notes (Signed)
   PRENATAL VISIT NOTE  Subjective:  Kellie Wilson is a 28 y.o. [id number] at [redacted]w[redacted]d being seen today for ongoing prenatal care.  She is currently monitored for the following issues for this low-risk pregnancy and has Supervision of other normal pregnancy, antepartum; History of poor fetal growth; Alpha thalassemia silent carrier; and Obesity affecting pregnancy, antepartum on their problem list.  Patient reports occasional contractions.  Contractions: Irritability. Vag. Bleeding: None.  Movement: Present. Denies leaking of fluid.   The following portions of the patient's history were reviewed and updated as appropriate: allergies, current medications, past family history, past medical history, past social history, past surgical history and problem list.   Objective:   Vitals:   08/28/23 0852  BP: 125/77  Pulse: 88  Weight: 224 lb (101.6 kg)    Fetal Status: Fetal Heart Rate (bpm): 135 Fundal Height: 34 cm Movement: Present     General:  Alert, oriented and cooperative. Patient is in no acute distress.  Skin: Skin is warm and dry. No rash noted.   Cardiovascular: Normal heart rate noted  Respiratory: Normal respiratory effort, no problems with respiration noted  Abdomen: Soft, gravid, appropriate for gestational age.  Pain/Pressure: Present     Pelvic: Cervical exam deferred        Extremities: Normal range of motion.  Edema: None  Mental Status: Normal mood and affect. Normal behavior. Normal judgment and thought content.   Assessment and Plan:  Pregnancy: [id number] at [redacted]w[redacted]d 1. Supervision of other normal pregnancy, antepartum (Primary) --Anticipatory guidance about next visits/weeks of pregnancy given.   2. [redacted] weeks gestation of pregnancy    3. Pregnancy affected by fetal growth restriction --10% on 08/08/23, repeat on 3/20  Preterm labor symptoms and general obstetric precautions including but not limited to vaginal bleeding, contractions, leaking of fluid and fetal movement  were reviewed in detail with the patient. Please refer to After Visit Summary for other counseling recommendations.   No follow-ups on file.  Future Appointments  Date Time Provider Department Center  08/31/2023  3:30 PM [facility] US5 [facility]  09/07/2023 11:30 AM [facility] US5 [facility]  09/12/2023  8:35 AM Leftwich-Kirby, Wilmer Floor, CNM [facility] None  09/19/2023  8:35 AM Leftwich-Kirby, Wilmer Floor, CNM [facility] None  09/25/2023  8:35 AM Leftwich-Kirby, Wilmer Floor, CNM [facility] None  10/03/2023  8:35 AM Leftwich-Kirby, Wilmer Floor, CNM [facility] None  10/09/2023  8:35 AM Leftwich-Kirby, Wilmer Floor, CNM [facility] None    Sharen Counter, CNM

## 2023-08-28 NOTE — Progress Notes (Signed)
 Pt presents for ROB visit. No concerns

## 2023-08-31 ENCOUNTER — Ambulatory Visit: Payer: Medicaid Other | Attending: Obstetrics and Gynecology

## 2023-08-31 DIAGNOSIS — O365930 Maternal care for other known or suspected poor fetal growth, third trimester, not applicable or unspecified: Secondary | ICD-10-CM

## 2023-08-31 DIAGNOSIS — Z3A30 30 weeks gestation of pregnancy: Secondary | ICD-10-CM

## 2023-08-31 DIAGNOSIS — O365990 Maternal care for other known or suspected poor fetal growth, unspecified trimester, not applicable or unspecified: Secondary | ICD-10-CM | POA: Diagnosis present

## 2023-09-07 ENCOUNTER — Ambulatory Visit: Payer: Medicaid Other | Attending: Obstetrics and Gynecology

## 2023-09-07 ENCOUNTER — Other Ambulatory Visit: Payer: Self-pay | Admitting: *Deleted

## 2023-09-07 DIAGNOSIS — O365990 Maternal care for other known or suspected poor fetal growth, unspecified trimester, not applicable or unspecified: Secondary | ICD-10-CM | POA: Insufficient documentation

## 2023-09-07 DIAGNOSIS — O99213 Obesity complicating pregnancy, third trimester: Secondary | ICD-10-CM

## 2023-09-07 DIAGNOSIS — O99013 Anemia complicating pregnancy, third trimester: Secondary | ICD-10-CM | POA: Diagnosis not present

## 2023-09-07 DIAGNOSIS — D563 Thalassemia minor: Secondary | ICD-10-CM

## 2023-09-07 DIAGNOSIS — Z3A36 36 weeks gestation of pregnancy: Secondary | ICD-10-CM

## 2023-09-07 DIAGNOSIS — E669 Obesity, unspecified: Secondary | ICD-10-CM

## 2023-09-07 DIAGNOSIS — O365930 Maternal care for other known or suspected poor fetal growth, third trimester, not applicable or unspecified: Secondary | ICD-10-CM

## 2023-09-12 ENCOUNTER — Ambulatory Visit (INDEPENDENT_AMBULATORY_CARE_PROVIDER_SITE_OTHER): Payer: Medicaid Other | Admitting: Advanced Practice Midwife

## 2023-09-12 ENCOUNTER — Other Ambulatory Visit (HOSPITAL_COMMUNITY)
Admission: RE | Admit: 2023-09-12 | Discharge: 2023-09-12 | Disposition: A | Discharge status: Home / Self Care | Source: Ambulatory Visit | Attending: Advanced Practice Midwife | Admitting: Advanced Practice Midwife

## 2023-09-12 VITALS — BP 131/73 | HR 95 | Wt 226.2 lb

## 2023-09-12 DIAGNOSIS — Z3480 Encounter for supervision of other normal pregnancy, unspecified trimester: Secondary | ICD-10-CM

## 2023-09-12 DIAGNOSIS — Z3A36 36 weeks gestation of pregnancy: Secondary | ICD-10-CM

## 2023-09-12 DIAGNOSIS — O365930 Maternal care for other known or suspected poor fetal growth, third trimester, not applicable or unspecified: Secondary | ICD-10-CM | POA: Diagnosis not present

## 2023-09-12 NOTE — Progress Notes (Signed)
 Pt presents for ROB visit. No concerns

## 2023-09-12 NOTE — Progress Notes (Signed)
   PRENATAL VISIT NOTE  Subjective:  Kellie Wilson is a 28 y.o. [id number] at [redacted]w[redacted]d being seen today for ongoing prenatal care.  She is currently monitored for the following issues for this low-risk pregnancy and has Supervision of other normal pregnancy, antepartum; History of poor fetal growth; Alpha thalassemia silent carrier; and Obesity affecting pregnancy, antepartum on their problem list.  Patient reports occasional contractions.  Contractions: Irritability. Vag. Bleeding: None.  Movement: Present. Denies leaking of fluid.   The following portions of the patient's history were reviewed and updated as appropriate: allergies, current medications, past family history, past medical history, past social history, past surgical history and problem list.   Objective:   Vitals:   09/12/23 0846  BP: 131/73  Pulse: 95  Weight: 226 lb 3.2 oz (102.6 kg)    Fetal Status: Fetal Heart Rate (bpm): 143 Fundal Height: 36 cm Movement: Present     General:  Alert, oriented and cooperative. Patient is in no acute distress.  Skin: Skin is warm and dry. No rash noted.   Cardiovascular: Normal heart rate noted  Respiratory: Normal respiratory effort, no problems with respiration noted  Abdomen: Soft, gravid, appropriate for gestational age.  Pain/Pressure: Present     Pelvic: Cervical exam performed in the presence of a chaperone Dilation: 1.5 Effacement (%): 30 Station: -3  Extremities: Normal range of motion.  Edema: None  Mental Status: Normal mood and affect. Normal behavior. Normal judgment and thought content.   Assessment and Plan:  Pregnancy: [id number] at [redacted]w[redacted]d 1. Supervision of other normal pregnancy, antepartum (Primary) --Anticipatory guidance about next visits/weeks of pregnancy given.   - Culture, beta strep (group b only) - Cervicovaginal ancillary only( Jack)  2. [redacted] weeks gestation of pregnancy   3. Poor fetal growth affecting management of mother in third trimester, single or  unspecified fetus --EFW was 10%, now 11% on 3/20 [place]. Weekly dopplers to continue and repeat growth to determine delivery recommendations per MFM.   Term labor symptoms and general obstetric precautions including but not limited to vaginal bleeding, contractions, leaking of fluid and fetal movement were reviewed in detail with the patient. Please refer to After Visit Summary for other counseling recommendations.   Return in about 1 week (around 09/19/2023).  Future Appointments  Date Time Provider Department Center  09/13/2023  7:00 AM [facility] PROVIDER 1 [facility]  09/13/2023  7:30 AM [facility] US1 [facility]  09/19/2023  8:35 AM Leftwich-Kirby, Wilmer Floor, CNM [facility] None  09/25/2023  8:35 AM Leftwich-Kirby, Wilmer Floor, CNM [facility] None  10/03/2023  8:35 AM Leftwich-Kirby, Wilmer Floor, CNM [facility] None  10/09/2023  8:35 AM Leftwich-Kirby, Wilmer Floor, CNM [facility] None    Sharen Counter, CNM

## 2023-09-13 ENCOUNTER — Ambulatory Visit (HOSPITAL_BASED_OUTPATIENT_CLINIC_OR_DEPARTMENT_OTHER): Admitting: Maternal & Fetal Medicine

## 2023-09-13 ENCOUNTER — Ambulatory Visit: Attending: Obstetrics

## 2023-09-13 ENCOUNTER — Other Ambulatory Visit: Payer: Self-pay | Admitting: *Deleted

## 2023-09-13 DIAGNOSIS — D563 Thalassemia minor: Secondary | ICD-10-CM

## 2023-09-13 DIAGNOSIS — Z87898 Personal history of other specified conditions: Secondary | ICD-10-CM | POA: Diagnosis present

## 2023-09-13 DIAGNOSIS — O365930 Maternal care for other known or suspected poor fetal growth, third trimester, not applicable or unspecified: Secondary | ICD-10-CM

## 2023-09-13 DIAGNOSIS — O99013 Anemia complicating pregnancy, third trimester: Secondary | ICD-10-CM

## 2023-09-13 DIAGNOSIS — E669 Obesity, unspecified: Secondary | ICD-10-CM

## 2023-09-13 DIAGNOSIS — Z3A36 36 weeks gestation of pregnancy: Secondary | ICD-10-CM

## 2023-09-13 DIAGNOSIS — O99213 Obesity complicating pregnancy, third trimester: Secondary | ICD-10-CM | POA: Diagnosis not present

## 2023-09-13 DIAGNOSIS — O365990 Maternal care for other known or suspected poor fetal growth, unspecified trimester, not applicable or unspecified: Secondary | ICD-10-CM | POA: Insufficient documentation

## 2023-09-13 LAB — CERVICOVAGINAL ANCILLARY ONLY
Chlamydia: NEGATIVE
Comment: NEGATIVE
Comment: NEGATIVE
Comment: NORMAL
Neisseria Gonorrhea: NEGATIVE
Trichomonas: NEGATIVE

## 2023-09-13 NOTE — Progress Notes (Signed)
   Patient information  Patient Name: Kellie Wilson  Patient MRN:   [id number]  Referring practice: MFM Referring Provider: [facility]  MFM CONSULT  Kellie Wilson is a 28 y.o. N8G9562 at [redacted]w[redacted]d here for ultrasound and consultation. Patient Active Problem List   Diagnosis Date Noted   Obesity affecting pregnancy, antepartum 05/01/2023   Alpha thalassemia silent carrier 03/29/2023   History of poor fetal growth 03/27/2023   Supervision of other normal pregnancy, antepartum 03/13/2023   Kellie Wilson has a pregnancy with the complications mentioned in the problem list. During today's visit we focused on the following concerns:   RE poor fetal growth: The patient's growth was most recently done on 08/31/2023 showing an estimated fetal weight at the 11tth percentile and the abdominal circumference measuring at the 13th percentile.  Today the ultrasound shows normal umbilical artery Dopplers and a biophysical profile of 8 out of 8.  I reassured the patient that these are normal findings for this gestational age.  I also encouraged her to continue weekly testing and have a growth ultrasound 3 weeks from her previous biometry ultrasound to confirm the growth pattern.  Sonographic findings Single intrauterine pregnancy. Fetal cardiac activity:  Observed and appears normal. Presentation: Cephalic. Interval fetal anatomy appears normal. Amniotic fluid: Within normal limits.  MVP: 5.71 cm. Placenta: Anterior. Umbilical artery dopplers findings: -S/D:2.53 which are normal at this gestational age.  -Absent end-diastolic flow: No.  -Reversed end-diastolic flow:  No. BPP 8/8.   Recommendations - Continue antenatal testing and serial growth [place] (every 3 weeks) - Fetal movement precautions given.  Review of Systems: A review of systems was performed and was negative except per HPI   Vitals and Physical Exam    09/13/2023    7:45 AM 09/12/2023    8:46 AM 09/07/2023   11:42 AM  Vitals with  BMI  Weight  226 lbs 3 oz   Systolic 116 131 130  Diastolic 64 73 67  Pulse  95 85    Sitting comfortably on the sonogram table Nonlabored breathing Normal rate and rhythm Abdomen is nontender  Past pregnancies OB History  Gravida Para Term Preterm AB Living  5 2 2  0 2 2  SAB IAB Ectopic Multiple Live Births  0 2 0 0 2    # Outcome Date GA Lbr Len/2nd Weight Sex Type Anes PTL Lv  5 Current           4 IAB 2024          3 IAB 2024          2 Term 03/12/21    M Vag-Spont   LIV  1 Term 01/27/16 [redacted]w[redacted]d    Vag-Spont   LIV    I spent 10 minutes reviewing the patients chart, including labs and images as well as counseling the patient about her medical conditions. Greater than 50% of the time was spent in direct face-to-face patient counseling.  Braxton Feathers, DO Maternal fetal medicine,    09/13/2023  8:24 AM

## 2023-09-15 LAB — CULTURE, BETA STREP (GROUP B ONLY): Strep Gp B Culture: POSITIVE — AB

## 2023-09-16 ENCOUNTER — Encounter: Payer: Self-pay | Admitting: Advanced Practice Midwife

## 2023-09-16 DIAGNOSIS — O99820 Streptococcus B carrier state complicating pregnancy: Secondary | ICD-10-CM | POA: Insufficient documentation

## 2023-09-19 ENCOUNTER — Ambulatory Visit (INDEPENDENT_AMBULATORY_CARE_PROVIDER_SITE_OTHER): Payer: Medicaid Other | Admitting: Advanced Practice Midwife

## 2023-09-19 VITALS — BP 135/59 | HR 85 | Wt 230.2 lb

## 2023-09-19 DIAGNOSIS — Z3A37 37 weeks gestation of pregnancy: Secondary | ICD-10-CM | POA: Diagnosis not present

## 2023-09-19 DIAGNOSIS — O365930 Maternal care for other known or suspected poor fetal growth, third trimester, not applicable or unspecified: Secondary | ICD-10-CM | POA: Diagnosis not present

## 2023-09-19 DIAGNOSIS — Z3480 Encounter for supervision of other normal pregnancy, unspecified trimester: Secondary | ICD-10-CM | POA: Diagnosis not present

## 2023-09-19 DIAGNOSIS — O1203 Gestational edema, third trimester: Secondary | ICD-10-CM | POA: Diagnosis not present

## 2023-09-19 NOTE — Progress Notes (Signed)
   PRENATAL VISIT NOTE  Subjective:  Kellie Wilson is a 28 y.o. [id number] at [redacted]w[redacted]d being seen today for ongoing prenatal care.  She is currently monitored for the following issues for this low-risk pregnancy and has Supervision of other normal pregnancy, antepartum; History of poor fetal growth; Alpha thalassemia silent carrier; Obesity affecting pregnancy, antepartum; and GBS (group B Streptococcus carrier), +RV culture, currently pregnant on their problem list.  Patient reports occasional contractions.  Contractions: Irritability. Vag. Bleeding: None.  Movement: Present. Denies leaking of fluid.   The following portions of the patient's history were reviewed and updated as appropriate: allergies, current medications, past family history, past medical history, past social history, past surgical history and problem list.   Objective:   Vitals:   09/19/23 0853  BP: (!) 135/59  Pulse: 85  Weight: 230 lb 3.2 oz (104.4 kg)    Fetal Status: Fetal Heart Rate (bpm): 150 Fundal Height: 38 cm Movement: Present     General:  Alert, oriented and cooperative. Patient is in no acute distress.  Skin: Skin is warm and dry. No rash noted.   Cardiovascular: Normal heart rate noted  Respiratory: Normal respiratory effort, no problems with respiration noted  Abdomen: Soft, gravid, appropriate for gestational age.  Pain/Pressure: Present     Pelvic: Cervical exam deferred        Extremities: Normal range of motion.  Edema: Trace  Mental Status: Normal mood and affect. Normal behavior. Normal judgment and thought content.   Assessment and Plan:  Pregnancy: [id number] at [redacted]w[redacted]d 1. Supervision of other normal pregnancy, antepartum (Primary) --Anticipatory guidance about next visits/weeks of pregnancy given.   2. [redacted] weeks gestation of pregnancy   3. Poor fetal growth affecting management of mother in third trimester, single or unspecified fetus --11% on 3/20, continue antenatal testing but d/c dopplers per  MFM --[place] on 4/11 to determine delivery plan --Orders for IOL placed, will schedule IOL at MFM recommendation   4. Edema during pregnancy in third trimester --Trace this morning, BP wnl --Elevated, compression   Term labor symptoms and general obstetric precautions including but not limited to vaginal bleeding, contractions, leaking of fluid and fetal movement were reviewed in detail with the patient. Please refer to After Visit Summary for other counseling recommendations.   Return in about 1 week (around 09/26/2023) for LOB.  Future Appointments  Date Time Provider Department Center  09/22/2023  8:00 AM [facility] PROVIDER 1 [facility]  09/22/2023  8:30 AM [facility] US3 [facility]  09/25/2023  8:35 AM Leftwich-Kirby, Wilmer Floor, CNM [facility] None  09/28/2023 10:45 AM [facility]  10/03/2023  8:35 AM Leftwich-Kirby, Wilmer Floor, CNM [facility] None  10/09/2023  8:35 AM Leftwich-Kirby, Wilmer Floor, CNM [facility] None    Sharen Counter, CNM

## 2023-09-19 NOTE — Progress Notes (Signed)
 Some cramps. No bleeding, no leaking.

## 2023-09-20 ENCOUNTER — Encounter: Payer: Self-pay | Admitting: Advanced Practice Midwife

## 2023-09-20 ENCOUNTER — Inpatient Hospital Stay (HOSPITAL_COMMUNITY)

## 2023-09-20 ENCOUNTER — Encounter (HOSPITAL_COMMUNITY): Payer: Self-pay | Admitting: Obstetrics and Gynecology

## 2023-09-20 ENCOUNTER — Inpatient Hospital Stay (HOSPITAL_COMMUNITY)
Admission: AD | Admit: 2023-09-20 | Discharge: 2023-09-22 | DRG: 807 | Disposition: A | Discharge status: Home / Self Care | Attending: Obstetrics & Gynecology | Admitting: Obstetrics & Gynecology

## 2023-09-20 ENCOUNTER — Telehealth (HOSPITAL_COMMUNITY): Payer: Self-pay | Admitting: *Deleted

## 2023-09-20 ENCOUNTER — Other Ambulatory Visit: Payer: Self-pay

## 2023-09-20 DIAGNOSIS — Z833 Family history of diabetes mellitus: Secondary | ICD-10-CM

## 2023-09-20 DIAGNOSIS — Z3A37 37 weeks gestation of pregnancy: Secondary | ICD-10-CM

## 2023-09-20 DIAGNOSIS — O99824 Streptococcus B carrier state complicating childbirth: Secondary | ICD-10-CM | POA: Diagnosis present

## 2023-09-20 DIAGNOSIS — O365930 Maternal care for other known or suspected poor fetal growth, third trimester, not applicable or unspecified: Secondary | ICD-10-CM | POA: Diagnosis present

## 2023-09-20 DIAGNOSIS — Z148 Genetic carrier of other disease: Secondary | ICD-10-CM

## 2023-09-20 DIAGNOSIS — Z3480 Encounter for supervision of other normal pregnancy, unspecified trimester: Principal | ICD-10-CM

## 2023-09-20 DIAGNOSIS — O99214 Obesity complicating childbirth: Secondary | ICD-10-CM | POA: Diagnosis present

## 2023-09-20 DIAGNOSIS — O99820 Streptococcus B carrier state complicating pregnancy: Secondary | ICD-10-CM | POA: Diagnosis not present

## 2023-09-20 DIAGNOSIS — O26893 Other specified pregnancy related conditions, third trimester: Secondary | ICD-10-CM | POA: Diagnosis present

## 2023-09-20 LAB — TYPE AND SCREEN
ABO/RH(D): A POS
Antibody Screen: NEGATIVE

## 2023-09-20 LAB — CBC
HCT: 34 % — ABNORMAL LOW (ref 36.0–46.0)
Hemoglobin: 10.9 g/dL — ABNORMAL LOW (ref 12.0–15.0)
MCH: 28.1 pg (ref 26.0–34.0)
MCHC: 32.1 g/dL (ref 30.0–36.0)
MCV: 87.6 fL (ref 80.0–100.0)
Platelets: 204 10*3/uL (ref 150–400)
RBC: 3.88 MIL/uL (ref 3.87–5.11)
RDW: 12.9 % (ref 11.5–15.5)
WBC: 13 10*3/uL — ABNORMAL HIGH (ref 4.0–10.5)
nRBC: 0 % (ref 0.0–0.2)

## 2023-09-20 MED ORDER — EPHEDRINE 5 MG/ML INJ: 10.0000 mg | INTRAVENOUS | Status: DC | PRN

## 2023-09-20 MED ORDER — OXYTOCIN BOLUS FROM INFUSION
333.0000 mL | Freq: Once | INTRAVENOUS | Status: AC
  Administered 2023-09-20: 333 mL via INTRAVENOUS

## 2023-09-20 MED ORDER — SOD CITRATE-CITRIC ACID 500-334 MG/5ML PO SOLN: 30.0000 mL | ORAL | Status: DC | PRN

## 2023-09-20 MED ORDER — ACETAMINOPHEN 325 MG PO TABS
650.0000 mg | ORAL_TABLET | ORAL | Status: DC | PRN
  Administered 2023-09-20 – 2023-09-21 (×2): 650 mg via ORAL
  Filled 2023-09-20 (×3): qty 2

## 2023-09-20 MED ORDER — IBUPROFEN 600 MG PO TABS
600.0000 mg | ORAL_TABLET | Freq: Four times a day (QID) | ORAL | Status: DC
  Administered 2023-09-20 – 2023-09-22 (×7): 600 mg via ORAL
  Filled 2023-09-20 (×6): qty 1

## 2023-09-20 MED ORDER — PENICILLIN G POT IN DEXTROSE 60000 UNIT/ML IV SOLN
3.0000 10*6.[IU] | INTRAVENOUS | Status: DC
  Administered 2023-09-20: 3 10*6.[IU] via INTRAVENOUS
  Filled 2023-09-20 (×3): qty 50

## 2023-09-20 MED ORDER — LIDOCAINE HCL (PF) 1 % IJ SOLN: 30.0000 mL | INTRAMUSCULAR | Status: DC | PRN

## 2023-09-20 MED ORDER — SODIUM CHLORIDE 0.9 % IV SOLN
5.0000 10*6.[IU] | Freq: Once | INTRAVENOUS | Status: AC
  Administered 2023-09-20: 5 10*6.[IU] via INTRAVENOUS
  Filled 2023-09-20: qty 5

## 2023-09-20 MED ORDER — PRENATAL MULTIVITAMIN CH
1.0000 | ORAL_TABLET | Freq: Every day | ORAL | Status: DC
  Administered 2023-09-21 – 2023-09-22 (×2): 1 via ORAL
  Filled 2023-09-20 (×2): qty 1

## 2023-09-20 MED ORDER — LACTATED RINGERS IV SOLN: INTRAVENOUS | Status: DC

## 2023-09-20 MED ORDER — OXYCODONE-ACETAMINOPHEN 5-325 MG PO TABS: 1.0000 | ORAL_TABLET | ORAL | Status: DC | PRN

## 2023-09-20 MED ORDER — BENZOCAINE-MENTHOL 20-0.5 % EX AERO: 1.0000 | INHALATION_SPRAY | CUTANEOUS | Status: DC | PRN

## 2023-09-20 MED ORDER — DIPHENHYDRAMINE HCL 50 MG/ML IJ SOLN: 12.5000 mg | INTRAMUSCULAR | Status: DC | PRN

## 2023-09-20 MED ORDER — ONDANSETRON HCL 4 MG/2ML IJ SOLN: 4.0000 mg | Freq: Four times a day (QID) | INTRAMUSCULAR | Status: DC | PRN

## 2023-09-20 MED ORDER — LACTATED RINGERS IV SOLN: 500.0000 mL | Freq: Once | INTRAVENOUS | Status: DC

## 2023-09-20 MED ORDER — LIDOCAINE HCL (PF) 1 % IJ SOLN
INTRAMUSCULAR | Status: DC | PRN
  Administered 2023-09-20 (×2): 5 mL via EPIDURAL

## 2023-09-20 MED ORDER — SENNOSIDES-DOCUSATE SODIUM 8.6-50 MG PO TABS
2.0000 | ORAL_TABLET | Freq: Every day | ORAL | Status: DC
  Administered 2023-09-21: 1 via ORAL
  Filled 2023-09-20 (×2): qty 2

## 2023-09-20 MED ORDER — DIPHENHYDRAMINE HCL 25 MG PO CAPS: 25.0000 mg | ORAL_CAPSULE | Freq: Four times a day (QID) | ORAL | Status: DC | PRN

## 2023-09-20 MED ORDER — LACTATED RINGERS IV SOLN
500.0000 mL | INTRAVENOUS | Status: DC | PRN
Start: 2023-09-20 — End: 2023-09-20

## 2023-09-20 MED ORDER — LEVONORGESTREL 20 MCG/DAY IU IUD
1.0000 | INTRAUTERINE_SYSTEM | Freq: Once | INTRAUTERINE | Status: AC
  Administered 2023-09-20: 1 via INTRAUTERINE
  Filled 2023-09-20: qty 1

## 2023-09-20 MED ORDER — FENTANYL-BUPIVACAINE-NACL 0.5-0.125-0.9 MG/250ML-% EP SOLN
12.0000 mL/h | EPIDURAL | Status: DC | PRN
  Administered 2023-09-20: 12 mL/h via EPIDURAL
  Filled 2023-09-20: qty 250

## 2023-09-20 MED ORDER — DIBUCAINE (PERIANAL) 1 % EX OINT: 1.0000 | TOPICAL_OINTMENT | CUTANEOUS | Status: DC | PRN

## 2023-09-20 MED ORDER — ONDANSETRON HCL 4 MG/2ML IJ SOLN: 4.0000 mg | INTRAMUSCULAR | Status: DC | PRN

## 2023-09-20 MED ORDER — PHENYLEPHRINE 80 MCG/ML (10ML) SYRINGE FOR IV PUSH (FOR BLOOD PRESSURE SUPPORT): 80.0000 ug | PREFILLED_SYRINGE | INTRAVENOUS | Status: DC | PRN

## 2023-09-20 MED ORDER — SIMETHICONE 80 MG PO CHEW: 80.0000 mg | CHEWABLE_TABLET | ORAL | Status: DC | PRN

## 2023-09-20 MED ORDER — OXYCODONE-ACETAMINOPHEN 5-325 MG PO TABS: 2.0000 | ORAL_TABLET | ORAL | Status: DC | PRN

## 2023-09-20 MED ORDER — ACETAMINOPHEN 325 MG PO TABS: 650.0000 mg | ORAL_TABLET | ORAL | Status: DC | PRN

## 2023-09-20 MED ORDER — OXYTOCIN-SODIUM CHLORIDE 30-0.9 UT/500ML-% IV SOLN
2.5000 [IU]/h | INTRAVENOUS | Status: DC
  Filled 2023-09-20: qty 500

## 2023-09-20 MED ORDER — ONDANSETRON HCL 4 MG PO TABS: 4.0000 mg | ORAL_TABLET | ORAL | Status: DC | PRN

## 2023-09-20 MED ORDER — OXYCODONE HCL 5 MG PO TABS
5.0000 mg | ORAL_TABLET | ORAL | Status: DC | PRN
  Administered 2023-09-20 – 2023-09-22 (×7): 5 mg via ORAL
  Filled 2023-09-20 (×7): qty 1

## 2023-09-20 MED ORDER — COCONUT OIL OIL: 1.0000 | TOPICAL_OIL | Status: DC | PRN

## 2023-09-20 MED ORDER — WITCH HAZEL-GLYCERIN EX PADS: 1.0000 | MEDICATED_PAD | CUTANEOUS | Status: DC | PRN

## 2023-09-20 NOTE — H&P (Addendum)
 OBSTETRIC ADMISSION HISTORY AND PHYSICAL  Kellie Wilson is a 28 y.o. female [phone] with IUP at [redacted]w[redacted]d by LMP presenting for SOL. Her ctx started at 0400 this morning and have continued increasing in strength and frequency. Her cervix changed from 2 to 4 cm in MAU and patient is requesting epidural at this time. She reports +FMs, No LOF, no VB, no blurry vision, headaches or peripheral edema, and RUQ pain.  She plans on feeding with pumped breastmilk. She requests an IUD for birth control. She received her prenatal care at  [facility].    Dating: By LMP --->  Estimated Date of Delivery: 10/05/23  Sono:    @[redacted]w[redacted]d , CWD, normal anatomy, cephalic presentation, anterior placenta lie, 2182g, 11% EFW   Prenatal History/Complications:   NURSING  PROVIDER  Office Location [facility] Dating by LMP c/w U/S at 13 wks  [facility] Model Traditional Anatomy U/S   Initiated care at  13wks                Language  English              LAB RESULTS   Support Person  Genetics NIPS: low risk female AFP:     NT/IT (FT only)     Carrier Screen Horizon: alpha thal silent carrier  Rhogam  A/Positive/-- (09/30 1100) A1C/GTT Early:  Third trimester:   Flu Vaccine     TDaP Vaccine   Blood Type A/Positive/-- (09/30 1100)  Covid Vaccine No Antibody Negative (09/30 1100)  RSV Vaccine  Rubella 2.36 (09/30 1100)  Feeding Plan breast (pumping) RPR Non Reactive (09/30 1100)  Contraception undecided HBsAg Negative (09/30 1100)  Circumcision  Yes if female HIV Non Reactive (09/30 1100)  Pediatrician   [facility]/ Sheliah Hatch HCVAb Non Reactive (09/30 1100)  Prenatal Classes       Pap Diagnosis  Date Value Ref Range Status  03/27/2023   Final   - Negative for intraepithelial lesion or malignancy (NILM)    BTLConsent  GC/CT Initial:   36wks:    VBAC  Consent  GBS   For PCN allergy, check sensitivities        DME Rx [X]  BP cuff [ ]  Weight Scale Waterbirth  [ ]  Class [ ]  Consent [ ]  CNM visit  PHQ9 & GAD7 [X]  new OB [  ] 28 weeks  [   ] 36 weeks Induction  [ ]  Orders Entered [ ] Foley Y/N    Past Medical History: Past Medical History:  Diagnosis Date   Medical history non-contributory     Past Surgical History: Past Surgical History:  Procedure Laterality Date   NO PAST SURGERIES      Obstetrical History: OB History     Gravida  5   Para  2   Term  2   Preterm  0   AB  2   Living  2      SAB  0   IAB  2   Ectopic  0   Multiple  0   Live Births  2           Social History Social History   Socioeconomic History   Marital status: Single    Spouse name: Not on file   Number of children: Not on file   Years of education: Not on file   Highest education level: Not on file  Occupational History   Not on file  Tobacco Use   Smoking status: Never   Smokeless tobacco:  Never  Vaping Use   Vaping status: Former   Quit date: 12/26/2022  Substance and Sexual Activity   Alcohol use: No   Drug use: No   Sexual activity: Yes  Other Topics Concern   Not on file  Social History Narrative   Not on file   Social Drivers of Health   Financial Resource Strain: Low Risk  (03/12/2021)   Received from [facility]   Overall Financial Resource Strain (CARDIA)    Difficulty of Paying Living Expenses: Not hard at all  Recent Concern: Financial Resource Strain - Medium Risk (02/18/2021)   Received from [facility]   Overall Financial Resource Strain (CARDIA)    Difficulty of Paying Living Expenses: Somewhat hard  Food Insecurity: No Food Insecurity (02/24/2021)   Received from [facility]   Hunger Vital Sign    Worried About Running Out of Food in the Last Year: Never true    Ran Out of Food in the Last Year: Never true  Transportation Needs: No Transportation Needs (02/18/2021)   Received from [facility]   PRAPARE - Transportation    Lack of Transportation (Medical): No    Lack of Transportation (Non-Medical): No  Physical Activity: Sufficiently Active (02/18/2021)   Received from  [facility]   Exercise Vital Sign    Days of Exercise per Week: 7 days    Minutes of Exercise per Session: 30 min  Stress: No Stress Concern Present (03/12/2021)   Received from [facility] - Occupational Stress Questionnaire    Feeling of Stress : Not at all  Social Connections: Unknown (10/24/2021)   Received from [facility]   Social Network    Social Network: Not on file    Family History: Family History  Problem Relation Age of Onset   Diabetes Maternal Grandmother     Allergies: No Known Allergies  Medications Prior to Admission  Medication Sig Dispense Refill Last Dose/Taking   Prenatal Vit-Fe Fumarate-FA (PRENATAL MULTIVITAMIN) TABS tablet Take 1 tablet by mouth daily at 12 noon.   09/19/2023   Blood Pressure Monitoring (BLOOD PRESSURE KIT) DEVI 1 Device by Does not apply route once a week. (Patient not taking: Reported on 09/19/2023) 1 each 0    ondansetron (ZOFRAN-ODT) 4 MG disintegrating tablet Take 1 tablet (4 mg total) by mouth every 6 (six) hours as needed for nausea. (Patient not taking: Reported on 09/19/2023) 20 tablet 0      Review of Systems   All systems reviewed and negative except as stated in HPI  Blood pressure 128/74, pulse 89, temperature (!) 97.5 F (36.4 C), temperature source Oral, resp. rate 20, height 5\' 4"  (1.626 m), weight 102.9 kg, last menstrual period 12/12/2022, SpO2 99%, unknown if currently breastfeeding. General appearance: alert, cooperative, and mild distress during contractions Lungs: clear to auscultation bilaterally Heart: regular rate and rhythm Abdomen: soft, non-tender; bowel sounds normal Extremities: Homans sign is negative, no sign of DVT Presentation: cephalic Fetal monitoringBaseline: 120 bpm, Variability: Good {> 6 bpm), Accelerations: Reactive, and Decelerations: Absent Uterine activityFrequency: Every 3-5 minutes Dilation: 4 cm Exam by:: K.Wilson,RN   Prenatal labs: ABO,  Rh: A/Positive/-- (09/30 1100) Antibody: Negative (09/30 1100) Rubella: 2.36 (09/30 1100) RPR: Non Reactive (02/03 0834)  HBsAg: Negative (09/30 1100)  HIV: Non Reactive (02/03 0834)  GBS: Positive/-- (04/01 0933)    Lab Results  Component Value Date   GBS Positive (A) 09/12/2023   GTT 70/122/86 Genetic screening  normal  Anatomy US normal  Immunization History  Administered Date(s) Administered   Influenza, Seasonal, Injecte, Preservative Fre 07/17/2023    Prenatal Transfer Tool  Maternal Diabetes: No Genetic Screening: Normal Maternal Ultrasounds/Referrals: hx of IUGR during current pregnancy, 11% on last ultrasound (08/31/23) Fetal Ultrasounds or other Referrals:  Referred to Materal Fetal Medicine  Maternal Substance Abuse:  No Significant Maternal Medications:  None Significant Maternal Lab Results: Group B Strep positive Number of Prenatal Visits:greater than 3 verified prenatal visits Maternal Vaccinations: flu Other Comments:  None   No results found for this or any previous visit (from the past 24 hours).  Patient Active Problem List   Diagnosis Date Noted   GBS (group B Streptococcus carrier), +RV culture, currently pregnant 09/16/2023   Obesity affecting pregnancy, antepartum 05/01/2023   Alpha thalassemia silent carrier 03/29/2023   History of poor fetal growth 03/27/2023   Supervision of other normal pregnancy, antepartum 03/13/2023    Assessment/Plan:  Kellie Wilson is a 28 y.o. [id number] at [redacted]w[redacted]d here for SOL.   #Labor:Expectant management #Pain: Epidural in place #FWB: Category I #GBS status:  Positive, plan to treat with PCN for prophylaxis #Feeding: Breastmilk , pumped #Reproductive Life planning: Mirena IUD #Circ:  not applicable  Iantha Fallen, Student-MidWife  09/20/2023, 11:53 AM  Midwife attestation:  I personally saw and evaluated the patient, performing the key elements of the service. I developed and verified the management plan that is  described in the resident's/student's note, and I agree with the content with my edits above. VSS, HRR&R, Resp unlabored, Legs neg.    Sharen Counter, CNM 3:17 PM

## 2023-09-20 NOTE — MAU Note (Signed)
 Kellie Wilson is a 28 y.o. at [redacted]w[redacted]d here in MAU reporting: she began having ctxs this morning @ 0430 this morning, states the ctxs are 5 minutes apart now.  Denies VB or LOF.  Endorses +FM. States was 2 cms @ last appt. LMP: 12/12/2022 Onset of complaint: today Pain score: 8 Vitals:   09/20/23 0828  BP: 112/63  Pulse: 80  Resp: 20  Temp: (!) 97.5 F (36.4 C)  SpO2: 99%     FHT: 129 bpm  Lab orders placed from triage: None

## 2023-09-20 NOTE — Anesthesia Procedure Notes (Signed)
 Epidural Patient location during procedure: OB Start time: 09/20/2023 1:15 PM End time: 09/20/2023 1:22 PM  Staffing Anesthesiologist: Lindon Nation, MD Performed: anesthesiologist   Preanesthetic Checklist Completed: patient identified, IV checked, risks and benefits discussed, monitors and equipment checked, pre-op evaluation and timeout performed  Epidural Patient position: sitting Prep: DuraPrep Patient monitoring: heart rate, cardiac monitor, continuous pulse ox and blood pressure Approach: midline Location: L3-L4 Injection technique: LOR air  Needle:  Needle type: Tuohy  Needle gauge: 17 G Needle length: 9 cm Needle insertion depth: 6 cm Catheter type: closed end flexible Catheter size: 19 Gauge Catheter at skin depth: 11 cm Test dose: negative  Assessment Sensory level: T8  Additional Notes Patient identified. Risks/Benefits/Options discussed with patient including but not limited to bleeding, infection, nerve damage, paralysis, failed block, incomplete pain control, headache, blood pressure changes, nausea, vomiting, reactions to medication both or allergic, itching and postpartum back pain. Confirmed with bedside nurse the patient's most recent platelet count. Confirmed with patient that they are not currently taking any anticoagulation, have any bleeding history or any family history of bleeding disorders. Patient expressed understanding and wished to proceed. All questions were answered. Sterile technique was used throughout the entire procedure. Please see nursing notes for vital signs. Test dose was given through epidural catheter and negative prior to continuing to dose epidural or start infusion. Warning signs of high block given to the patient including shortness of breath, tingling/numbness in hands, complete motor block, or any concerning symptoms with instructions to call for help. Patient was given instructions on fall risk and not to get out of bed. All questions and  concerns addressed with instructions to call with any issues or inadequate analgesia.  Reason for block:procedure for pain

## 2023-09-20 NOTE — Anesthesia Preprocedure Evaluation (Signed)
 Anesthesia Evaluation  Patient identified by MRN, date of birth, ID band Patient awake    Reviewed: Allergy & Precautions, H&P , NPO status , Patient's Chart, lab work & pertinent test results  Airway Mallampati: II  TM Distance: >3 FB Neck ROM: Full    Dental no notable dental hx.    Pulmonary neg pulmonary ROS   Pulmonary exam normal breath sounds clear to auscultation       Cardiovascular negative cardio ROS Normal cardiovascular exam Rhythm:Regular Rate:Normal     Neuro/Psych negative neurological ROS  negative psych ROS   GI/Hepatic negative GI ROS, Neg liver ROS,,,  Endo/Other  negative endocrine ROS    Renal/GU negative Renal ROS  negative genitourinary   Musculoskeletal negative musculoskeletal ROS (+)    Abdominal   Peds negative pediatric ROS (+)  Hematology Alpha thalassemia silent carrier   Anesthesia Other Findings Plt 204  Reproductive/Obstetrics (+) Pregnancy                             Anesthesia Physical Anesthesia Plan  ASA: 2  Anesthesia Plan: Epidural   Post-op Pain Management:    Induction:   PONV Risk Score and Plan: Treatment may vary due to age or medical condition  Airway Management Planned: Natural Airway  Additional Equipment:   Intra-op Plan:   Post-operative Plan:   Informed Consent: I have reviewed the patients History and Physical, chart, labs and discussed the procedure including the risks, benefits and alternatives for the proposed anesthesia with the patient or authorized representative who has indicated his/her understanding and acceptance.       Plan Discussed with: Anesthesiologist  Anesthesia Plan Comments: (Patient identified. Risks, benefits, options discussed with patient including but not limited to bleeding, infection, nerve damage, paralysis, failed block, incomplete pain control, headache, blood pressure changes, nausea,  vomiting, reactions to medication, itching, and post partum back pain. Confirmed with bedside nurse the patient's most recent platelet count. Confirmed with the patient that they are not taking any anticoagulation, have any bleeding history or any family history of bleeding disorders. Patient expressed understanding and wishes to proceed. All questions were answered. )       Anesthesia Quick Evaluation

## 2023-09-20 NOTE — Progress Notes (Signed)
 Kellie Wilson is a 28 y.o. [id number] at [redacted]w[redacted]d by LMP admitted for SOL.  Subjective: Resting comfortably with epidural.  Objective: BP 116/68   Pulse 65   Temp (!) 97.5 F (36.4 C) (Oral)   Resp 18   Ht 5\' 4"  (1.626 m)   Wt 102.9 kg   LMP 12/12/2022 (Approximate)   SpO2 100%   BMI 38.93 kg/m    FHT:  FHR: 120 bpm, variability: moderate,  accelerations:  Present,  decelerations:  Absent UC:   regular, every 3-5 minutes SVE:   Dilation: 5 Effacement (%): 70 Station: -1 Exam by:: Zorita Pang, CNM student  Labs: Lab Results  Component Value Date   WBC 13.0 (H) 09/20/2023   HGB 10.9 (L) 09/20/2023   HCT 34.0 (L) 09/20/2023   MCV 87.6 09/20/2023   PLT 204 09/20/2023    Assessment / Plan: Spontaneous labor, progressing normally, discussed benefits and risks of amniotomy, patient agreeable to plan of care. AROM performed at 1732 with clear fluid.  Labor: Progressing normally Preeclampsia:   BP's stable Fetal Wellbeing:  Category I Pain Control:  Epidural I/D:   GBS positive, second penicillin dose was started at 1725. Anticipated MOD:  NSVD  Iantha Fallen, Student-MidWife 09/20/2023, 5:52 PM

## 2023-09-20 NOTE — Telephone Encounter (Signed)
 Preadmission screen

## 2023-09-20 NOTE — Lactation Note (Signed)
 This note was copied from a baby's chart. Lactation Consultation Note  Patient Name: Kellie Wilson [id number] Date: 09/20/2023 Age:27 hours  Per MOB, she BF her first child for 2 years and recently stopped in August 2025. MOB feels infant is latching well at the breast but she is planing to breast feed, pump and supplement infant with formula this is her feeding. MOB informed LC she is planning to return to work and school at 3-4 weeks postpartum and at that time she will pump only and formula feed infant. MOB will continue to latch infant first with every feeding and then supplement infant with her EBM first before offering formula. MOB has handout Supplement with Breastfeeding. MOB knows that her EBM is safe for 4 hours whereas formula once open is only safe 1 hour. MOB was  made aware of O/P services, breastfeeding support groups, community resources, and our phone # for post-discharge questions.    Current breastfeeding plan: 1- MOB will continue to BF infant by cues, on demand, every 2-3 hours, skin to skin. Will latch infant first for every feeding before supplement with EBM or formula this is her choice. 2- MOB will continue to pump every 3 hours for 15 minutes on initial setting, give back any EBM first before formula. 3- MOB knows to call [facility] services if she has any questions, concerns or need latch assistance.   Maternal Data    Feeding Nipple Type: Slow - flow  LATCH Score ( Done by RN)  Latch: Repeated attempts needed to sustain latch, nipple held in mouth throughout feeding, stimulation needed to elicit sucking reflex.  Audible Swallowing: A few with stimulation  Type of Nipple: Everted at rest and after stimulation  Comfort (Breast/Nipple): Soft / non-tender  Hold (Positioning): Assistance needed to correctly position infant at breast and maintain latch.  LATCH Score: 7   Lactation Tools Discussed/Used    Interventions    Discharge [facility] Program: Yes  Consult  Status      Frederico Hamman 09/20/2023, 10:26 PM

## 2023-09-20 NOTE — Discharge Summary (Signed)
 Postpartum Discharge Summary  Date of Service updated***     Patient Name: Kellie Wilson DOB: [birth year] MRN: [id number]  Date of admission: 09/20/2023 Delivery date:09/20/2023 Delivering provider: Sharen Counter A Date of discharge: 09/20/2023  Admitting diagnosis: Fetal growth restriction antepartum [O36.5990] Intrauterine pregnancy: [redacted]w[redacted]d     Secondary diagnosis:  Active Problems:   * No active hospital problems. *  Additional problems: ***    Discharge diagnosis: Term Pregnancy Delivered                                              Post partum procedures: n/a Augmentation: AROM Complications: None  Hospital course: Onset of Labor With Vaginal Delivery      28 y.o. yo [id number] at [redacted]w[redacted]d was admitted in Active Labor on 09/20/2023. Labor course was uncomplicated. Membrane Rupture Time/Date: 5:34 PM,09/20/2023  Delivery Method:Vaginal, Spontaneous Operative Delivery:N/A Episiotomy: None Lacerations:  Labial Patient had a postpartum course complicated by ***.  She is ambulating, tolerating a regular diet, passing flatus, and urinating well. Patient is discharged home in stable condition on 09/20/23.  Newborn Data: Birth date:09/20/2023 Birth time:6:36 PM Gender:Female Living status:Living Apgars:8 ,9  Weight:   Magnesium Sulfate received: No BMZ received: No Rhophylac:No MMR:No T-DaP: no Flu: Yes RSV Vaccine received: No Transfusion:No  Immunizations received: Immunization History  Administered Date(s) Administered   Influenza, Seasonal, Injecte, Preservative Fre 07/17/2023    Physical exam  Vitals:   09/20/23 1846 09/20/23 1901 09/20/23 1917 09/20/23 1932  BP: (!) 130/90 134/85 125/76 (!) 111/59  Pulse: 93 96 90 93  Resp:      Temp:      TempSrc:      SpO2:      Weight:      Height:       General: {Exam; general:21111117} Lochia: {Desc; appropriate/inappropriate:30686::"appropriate"} Uterine Fundus: {Desc; firm/soft:30687} Incision: {Exam;  incision:21111123} DVT Evaluation: {Exam; dvt:2111122} Labs: Lab Results  Component Value Date   WBC 13.0 (H) 09/20/2023   HGB 10.9 (L) 09/20/2023   HCT 34.0 (L) 09/20/2023   MCV 87.6 09/20/2023   PLT 204 09/20/2023      Latest Ref Rng & Units 06/05/2023    9:10 PM  CMP  Glucose 70 - 99 mg/dL 69   BUN 6 - 20 mg/dL 9   Creatinine 9.52 - 8.41 mg/dL 3.24   Sodium 401 - 027 mmol/L 136   Potassium 3.5 - 5.1 mmol/L 3.6   Chloride 98 - 111 mmol/L 107   CO2 22 - 32 mmol/L 17   Calcium 8.9 - 10.3 mg/dL 8.7   Total Protein 6.5 - 8.1 g/dL 6.8   Total Bilirubin <2.5 mg/dL 0.9   Alkaline Phos 38 - 126 U/L 91   AST 15 - 41 U/L 26   ALT 0 - 44 U/L 19    Edinburgh Score:     No data to display         No data recorded  After visit meds:  Allergies as of 09/20/2023   No Known Allergies   Med Rec must be completed prior to using this [facility]***        Discharge home in stable condition Infant Feeding: {Baby feeding:23562} Infant Disposition:{CHL IP OB HOME WITH DGUYQI:34742} Discharge instruction: per After Visit Summary and Postpartum booklet. Activity: Advance as tolerated. Pelvic rest for 6 weeks.  Diet: {OB diet:21111121}  Future Appointments: Future Appointments  Date Time Provider Department Center  09/22/2023  8:00 AM [facility] PROVIDER 1 [facility]  09/22/2023  8:30 AM [facility] US3 [facility]  09/25/2023  8:35 AM Leftwich-Kirby, Wilmer Floor, CNM [facility] None  09/28/2023 10:45 AM [facility] NST [facility]  10/03/2023  8:35 AM Leftwich-Kirby, Wilmer Floor, CNM [facility] None  10/09/2023  8:35 AM Leftwich-Kirby, Wilmer Floor, CNM [facility] None   Follow up Visit:  Message sent to [facility] on 09/20/23: Please schedule this patient for a In person postpartum visit in 6 weeks with the following provider:  Sharen Counter . Additional Postpartum F/U: n/a   Low risk pregnancy complicated by:  FGR Delivery mode:  Vaginal, Spontaneous Anticipated Birth Control:   IUD placed  inpatient   09/20/2023 Sharen Counter, CNM

## 2023-09-21 LAB — RPR: RPR Ser Ql: NONREACTIVE

## 2023-09-21 MED ORDER — ACETAMINOPHEN 325 MG PO TABS
650.0000 mg | ORAL_TABLET | Freq: Four times a day (QID) | ORAL | Status: DC | PRN
  Administered 2023-09-21 – 2023-09-22 (×5): 650 mg via ORAL
  Filled 2023-09-21 (×4): qty 2

## 2023-09-21 NOTE — Progress Notes (Addendum)
 Post Partum Day #1 Subjective: up ad lib, voiding, tolerating PO, and cramping that subsides with pain medication.  Objective: Blood pressure 110/71, pulse 63, temperature 97.8 F (36.6 C), temperature source Oral, resp. rate 14, height 5\' 4"  (1.626 m), weight 102.9 kg, last menstrual period 12/12/2022, SpO2 97%, unknown if currently breastfeeding.  Physical Exam:  General: alert, cooperative, and no distress Lochia: appropriate Uterine Fundus: firm Incision: N/A DVT Evaluation: No evidence of DVT seen on physical exam.  Recent Labs    09/20/23 1204  HGB 10.9*  HCT 34.0*    Assessment/Plan:  - Encouraged to void every 2 hours and prior to breastfeeding to minimize additional cramping - Encouraged to take tylenol and ibuprofen together for maximum relief without oxycodone side effects. May also continue taking oxycodone for breakthrough pain. - Plan for discharge tomorrow   LOS: 1 day   Elige Ko, Student-MidWife 09/21/2023, 10:28 AM

## 2023-09-21 NOTE — Anesthesia Postprocedure Evaluation (Signed)
 Anesthesia Post Note  Patient: Kellie Wilson  Procedure(s) Performed: AN AD HOC LABOR EPIDURAL     Patient location during evaluation: Mother Baby Anesthesia Type: Epidural Level of consciousness: awake and alert Pain management: pain level controlled Vital Signs Assessment: post-procedure vital signs reviewed and stable Respiratory status: spontaneous breathing, nonlabored ventilation and respiratory function stable Cardiovascular status: stable Postop Assessment: no headache, no backache and epidural receding Anesthetic complications: no   No notable events documented.  Last Vitals:  Vitals:   09/20/23 2230 09/21/23 0300  BP: 101/60 109/69  Pulse: 62 74  Resp: 17 17  Temp: 37.1 C 37 C  SpO2: 100%     Last Pain:  Vitals:   09/21/23 0633  TempSrc:   PainSc: 0-No pain   Pain Goal:                   [name]

## 2023-09-21 NOTE — Lactation Note (Signed)
 This note was copied from a baby's chart. Lactation Consultation Note  Patient Name: Girl Lundon Rosier WGNFA'O Date: 09/21/2023 Age:27 hours Reason for consult: Follow-up assessment;Mother's request;Exclusive pumping and bottle feeding;Early term 37-38.6wks  P3- MOB requested assistance with infant's next bottle feeding. MOB was able to pace feed infant 10 mL, then LC assisted with the last 10 mL. Infant was very coordinated with this feeding and seemed to have no issues. LC praised MOB and infant. LC encouraged MOB to call for further assistance as needed.  Maternal Data Has patient been taught Hand Expression?: Yes Does the patient have breastfeeding experience prior to this delivery?: Yes How long did the patient breastfeed?: 2 years  Feeding Mother's Current Feeding Choice: Breast Milk and Formula Nipple Type: Nfant Standard Flow (white)  Lactation Tools Discussed/Used Tools: Pump;Flanges Flange Size: 18 Breast pump type: Double-Electric Breast Pump;Manual Pump Education: Setup, frequency, and cleaning;Milk Storage Reason for Pumping: exclusive pumping Pumping frequency: 15-20 min Pumped volume: 16 mL  Interventions Interventions: Breast feeding basics reviewed;Education;Pace feeding;LC Services brochure  Discharge Discharge Education: Engorgement and breast care;Warning signs for feeding baby Pump: Manual;Hands Free;Personal  Consult Status Consult Status: Follow-up Date: 09/22/23 Follow-up type: In-patient    Dema Severin BS, IBCLC 09/21/2023, 8:17 PM

## 2023-09-21 NOTE — Lactation Note (Signed)
 This note was copied from a baby's chart. Lactation Consultation Note  Patient Name: Kellie Wilson [id number] Date: 09/21/2023 Age:27 hours Reason for consult: Follow-up assessment;Early term 37-38.6wks  P3- MOB informed LC that she would eventually like to exclusively pump only, but infant is struggling with bottle feedings, so she is offering her breast as well. MOB had just pumped 16 mL of EBM right before LC entered the room. MOB was getting ready to offer it to infant. LC offered to assist and MOB consented. LC placed infant in a side lying pace feed position. LC used a white Nfant nipple during this bottle feeding. After 20-25 minutes of bottle feeding, LC was able to get infant to drink all 16 mL of EBM. LC noted that infant was very uncoordinated and was tongue thrusting at this time. MOB told LC that she will call for infant's next feeding so LC could assist with the bottle again. LC encouraged MOB to call for further assistance as needed. RN updated on how infant did.  Maternal Data Has patient been taught Hand Expression?: Yes Does the patient have breastfeeding experience prior to this delivery?: Yes How long did the patient breastfeed?: 2 years  Feeding Mother's Current Feeding Choice: Breast Milk and Formula Nipple Type: Nfant Standard Flow (white)  Lactation Tools Discussed/Used Tools: Pump;Flanges Breast pump type: Double-Electric Breast Pump;Manual Pump Education: Setup, frequency, and cleaning;Milk Storage Reason for Pumping: MOB request Pumping frequency: 15-20 min every 3 hrs Pumped volume: 16 mL  Interventions Interventions: Breast feeding basics reviewed;Education;Pace feeding;LC Services brochure  Discharge Discharge Education: Engorgement and breast care;Warning signs for feeding baby Pump: DEBP;Personal  Consult Status Consult Status: Follow-up Date: 09/22/23 Follow-up type: In-patient    Dema Severin BS, IBCLC 09/21/2023, 6:21 PM

## 2023-09-22 ENCOUNTER — Ambulatory Visit

## 2023-09-22 MED ORDER — OXYCODONE HCL 5 MG PO TABS: 5.0000 mg | ORAL_TABLET | ORAL | Status: DC | PRN

## 2023-09-22 MED ORDER — ACETAMINOPHEN 500 MG PO TABS: 1000.0000 mg | ORAL_TABLET | Freq: Four times a day (QID) | ORAL | 0 refills | Status: AC

## 2023-09-22 MED ORDER — OXYCODONE HCL 5 MG PO TABS: 5.0000 mg | ORAL_TABLET | Freq: Four times a day (QID) | ORAL | 0 refills | Status: AC | PRN

## 2023-09-22 MED ORDER — OXYCODONE HCL 5 MG PO TABS
10.0000 mg | ORAL_TABLET | ORAL | Status: DC | PRN
  Administered 2023-09-22 (×3): 10 mg via ORAL
  Filled 2023-09-22 (×3): qty 2

## 2023-09-22 MED ORDER — IBUPROFEN 600 MG PO TABS: 600.0000 mg | ORAL_TABLET | Freq: Four times a day (QID) | ORAL | 0 refills | Status: AC

## 2023-09-22 NOTE — Progress Notes (Signed)
 Patient has been experiencing very intense contractions/cramping for couple hours.  Meds are wearing off exactly or right at the frequency time (gave ibuprofen, tylenol & oxycodone 5mg s several times overnight).  Mom states she feels like she is contracting/delivering a placenta.  Called 1st call L&D ([facility]-Dish[facility] CNM) to see if patient could try 10mg  oxycodone (currently only has prescription for 5mg  oxycodone q4h prn.  She said ok to change the order to 5-10mg  oxycodone q4hrs prn.

## 2023-09-22 NOTE — Lactation Note (Signed)
 This note was copied from a baby's chart. Lactation Consultation Note  Patient Name: Kellie Wilson [id number] Date: 09/22/2023 Age:27 hours Reason for consult: Follow-up assessment;Early term 37-38.6wks;Infant weight loss (4 % weight loss), P3  See below under pumping moms feeding plan for home.  LC reviewed breast feeding D/C teaching and stressed the importance of consistent pumping if the baby isn't latching, engorgement prevention and tx. Per mom has a DEBP at home and aware of the [facility] resources.   Maternal Data Has patient been taught Hand Expression?: Yes  Feeding Mother's Current Feeding Choice: Breast Milk and Formula  LATCH Score - LC unable to assess latch baby recently breast fed, and now formula feeding.     Lactation Tools Discussed/Used Tools: Pump;Flanges Flange Size: 18;Other (comment) (per mom comfortable) Breast pump type: Double-Electric Breast Pump Pump Education: Milk Storage;Setup, frequency, and cleaning;Other (comment) (has been pumping) Reason for Pumping: per  mom plans to pump mostly and also allow her baby to breast feed too Pumped volume: 20 mL  Interventions  Education  LC resources and storage of breast milk  Discharge Discharge Education: Engorgement and breast care;Warning signs for feeding baby Pump: Personal;Hands Free;Manual  Consult Status Consult Status: Complete Date: 09/22/23    Kathrin Greathouse 09/22/2023, 10:28 AM

## 2023-09-25 ENCOUNTER — Encounter: Payer: Medicaid Other | Admitting: Advanced Practice Midwife

## 2023-09-28 ENCOUNTER — Ambulatory Visit

## 2023-09-29 ENCOUNTER — Inpatient Hospital Stay (HOSPITAL_COMMUNITY): Admission: RE | Admit: 2023-09-29 | Source: Home / Self Care

## 2023-09-29 ENCOUNTER — Inpatient Hospital Stay (HOSPITAL_COMMUNITY)

## 2023-10-02 ENCOUNTER — Telehealth (HOSPITAL_COMMUNITY): Payer: Self-pay

## 2023-10-02 NOTE — Telephone Encounter (Signed)
 10/02/2023 [id number]  Name: Kellie Wilson MRN: [id number] DOB: July 10, 1995  Reason for Call:  Transition of Care Hospital Discharge Call  Contact Status: Patient Contact Status: Complete  Language assistant needed:          Follow-Up Questions: Do You Have Any Concerns About Your Health As You Heal From Delivery?: Yes What Concerns Do You Have About Your Health?: Patient states that she had an IUD placed after delivery. She states that she has a pressure sentation in the vagina that is more when she is having a bowel movement. She states that she cannot feel the strings from the IUD. RN recommended that she contact her OB-GYN with this concern. Patient states that she was planning to call the office today to schedule an appointment. Patient has no other questions or concerns at this time. Do You Have Any Concerns About Your Infants Health?: No (Patient states that baby is doing well.)  Edinburgh Postnatal Depression Scale:  In the Past 7 Days:    PHQ2-9 Depression Scale:     Discharge Follow-up: Edinburgh score requires follow up?:  (RN explained EPDS to patient and patient declines EPDS today. Patient states that she is doing well good emotionally. She states that her mother and boyfriend check in on her frequently and are good supports.) Patient was advised of the following resources:: Support Group, Breastfeeding Support Group Patient referred to:: OB  Post-discharge interventions: Reviewed Newborn Safe Sleep Practices  [employer]  [employer]

## 2023-10-03 ENCOUNTER — Encounter: Payer: Medicaid Other | Admitting: Advanced Practice Midwife

## 2023-10-09 ENCOUNTER — Encounter: Payer: Medicaid Other | Admitting: Advanced Practice Midwife

## 2023-10-31 ENCOUNTER — Ambulatory Visit: Admitting: Advanced Practice Midwife

## 2024-01-22 ENCOUNTER — Telehealth: Payer: Self-pay
